# Patient Record
Sex: Male | Born: 1995 | Race: White | Hispanic: No | Smoking: Never smoker
Health system: Southern US, Community
[De-identification: ages and names within clinical notes are randomized; demographics above are authoritative.]

## PROBLEM LIST (undated history)

## (undated) DIAGNOSIS — Q539 Undescended testicle, unspecified: Secondary | ICD-10-CM

## (undated) DIAGNOSIS — G40909 Epilepsy, unspecified, not intractable, without status epilepticus: Secondary | ICD-10-CM

---
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
## 2017-07-16 IMAGING — US US ART/VEN ABD/PELV/SCROTUM DOPPLER LTD
1 series · 14 of 25 positions shown · non-contrast
Comparison: Prior testicular ultrasound 08/17/2011

CLINICAL DATA: 19-year-old male with right testicular pain

EXAM:
ULTRASOUND OF SCROTUM
TECHNIQUE: Complete ultrasound examination of the testicles, epididymis, and
other scrotal structures was performed.

[Series 1: us art/ven abd/pelv/scrotum doppler ltd · 0.07mm/px · 43 acquisitions, 14 frames shown]
[im 1/43]
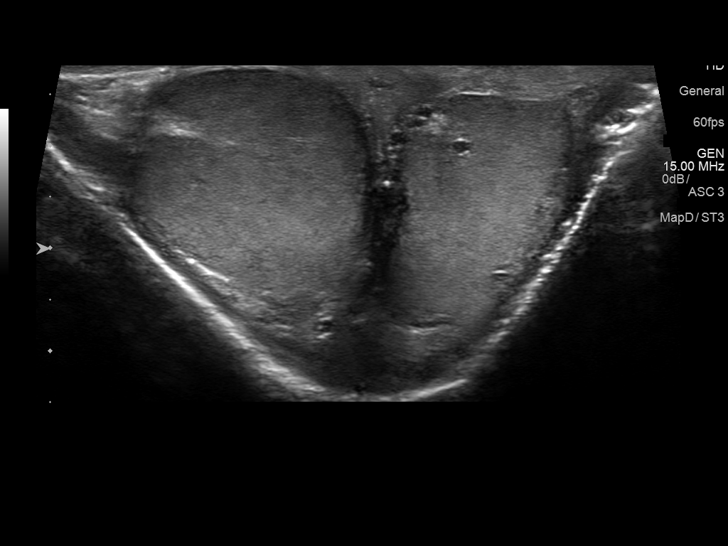
[im 4/43]
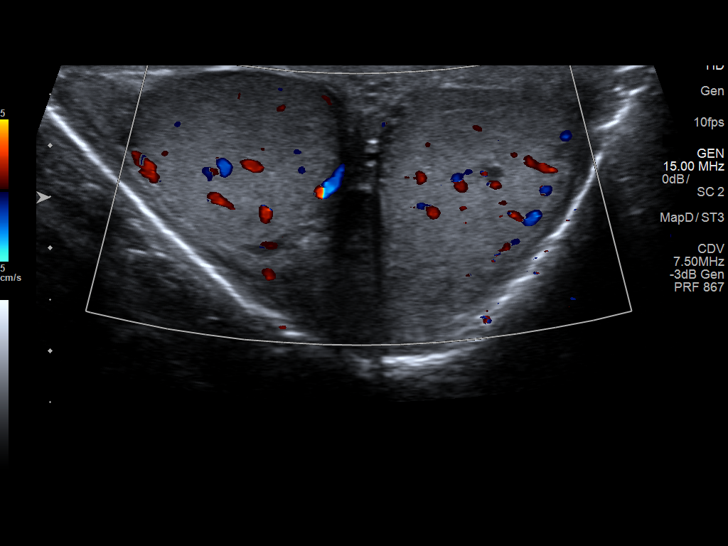
[im 8/43]
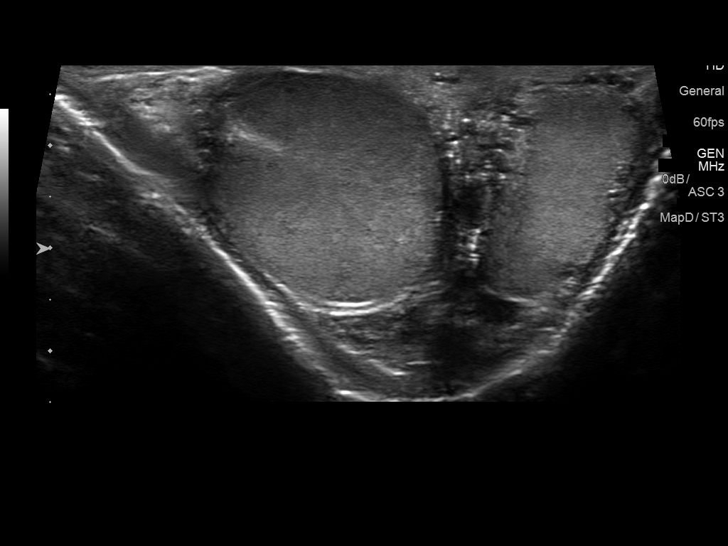
[im 11/43]
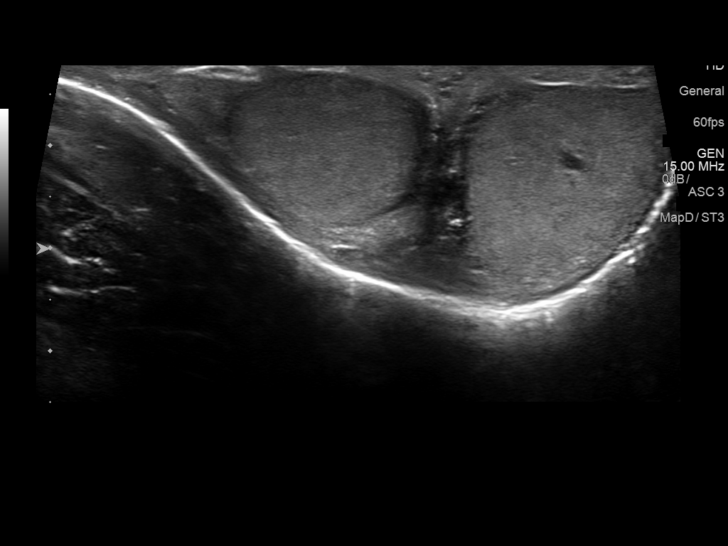
[im 15/43]
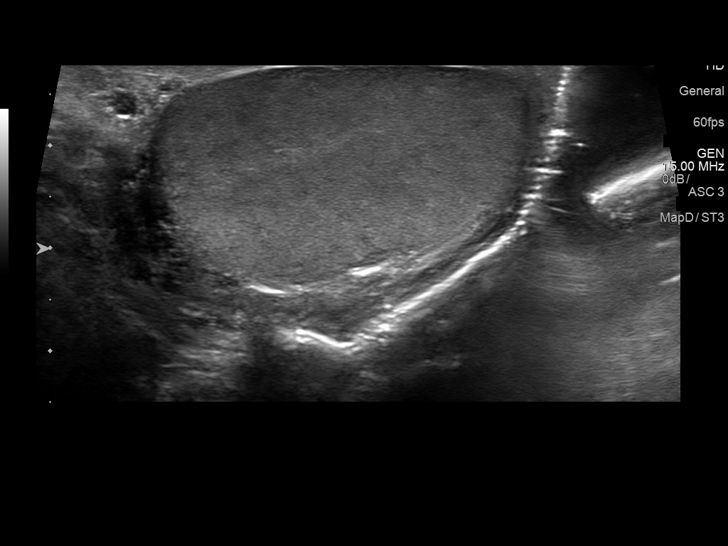
[im 16/43]
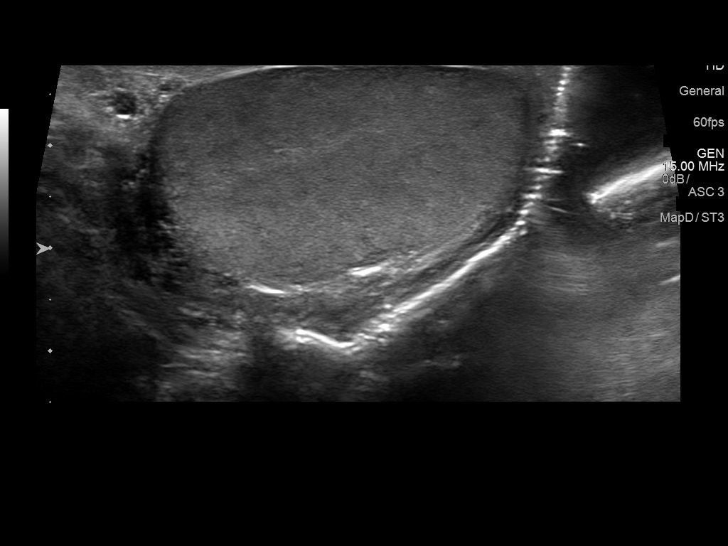
[im 20/43]
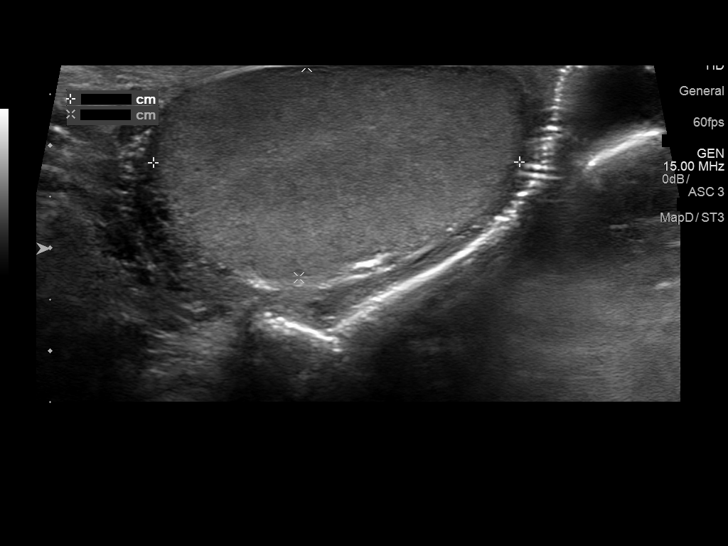
[im 23/43]
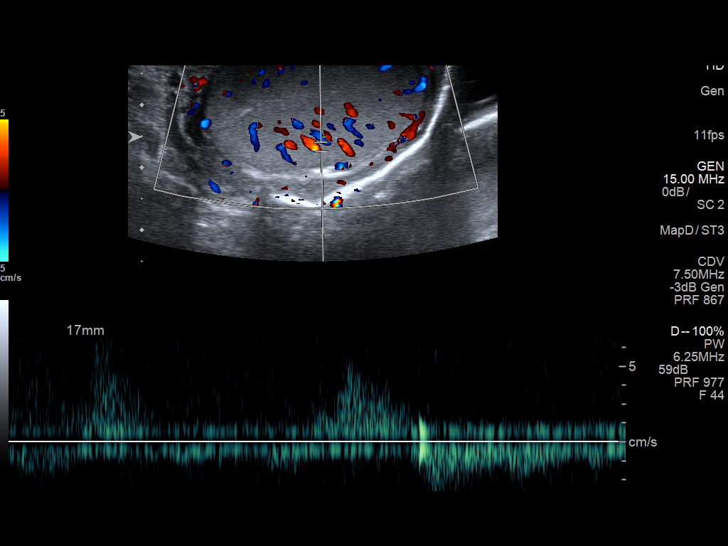
[im 27/43]
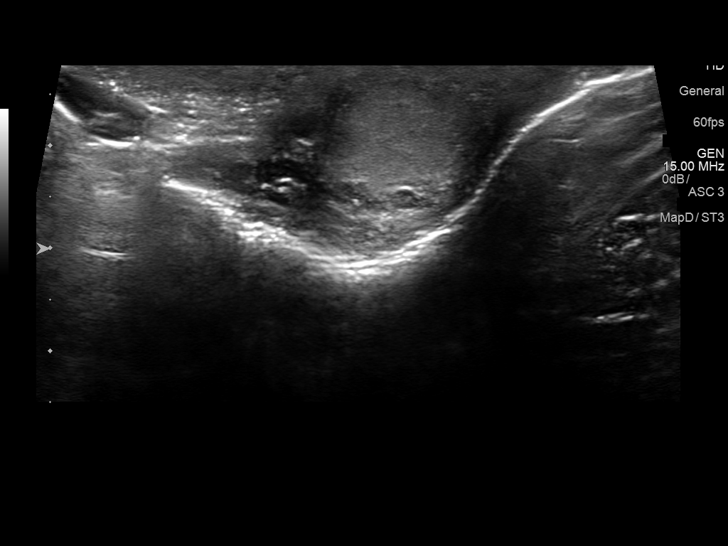
[im 29/43]
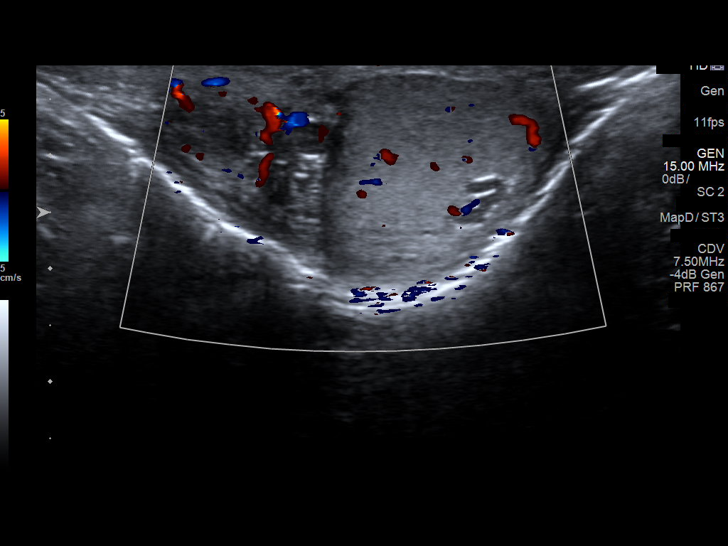
[im 32/43]
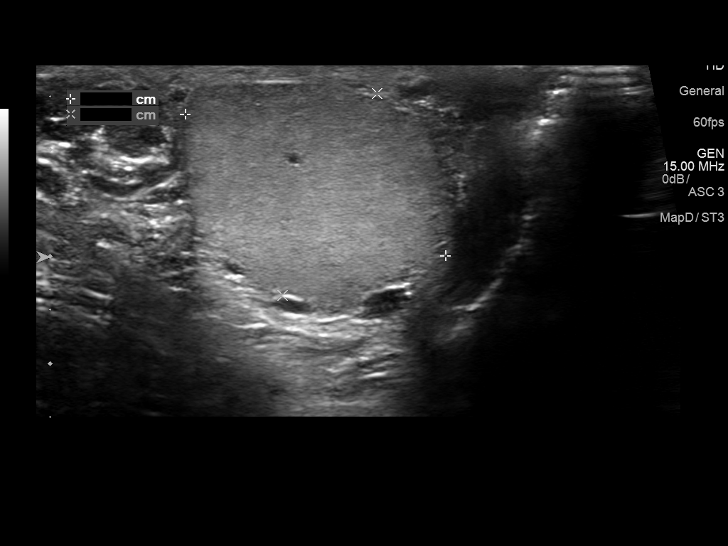
[im 36/43]
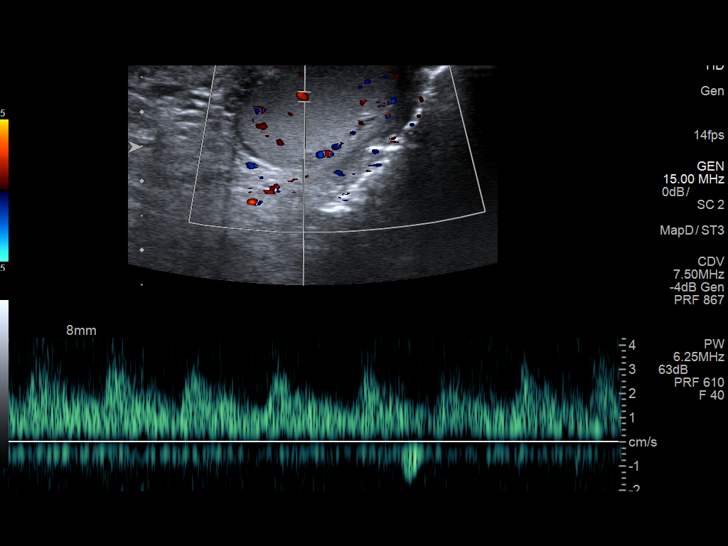
[im 39/43]
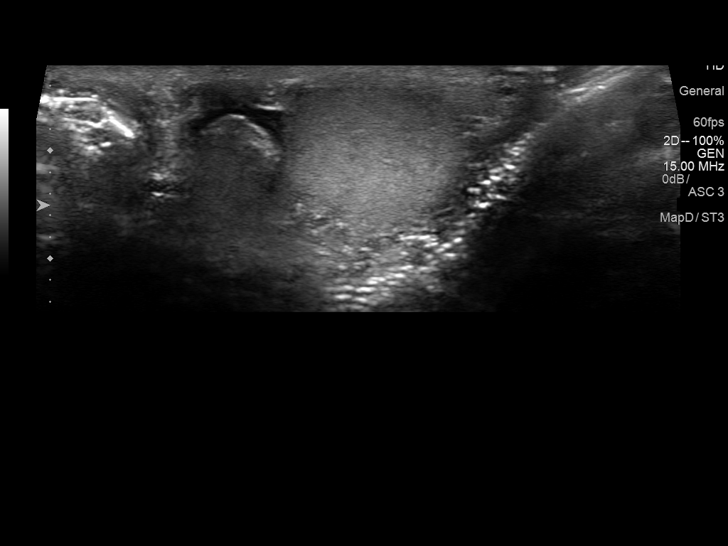
[im 43/43]
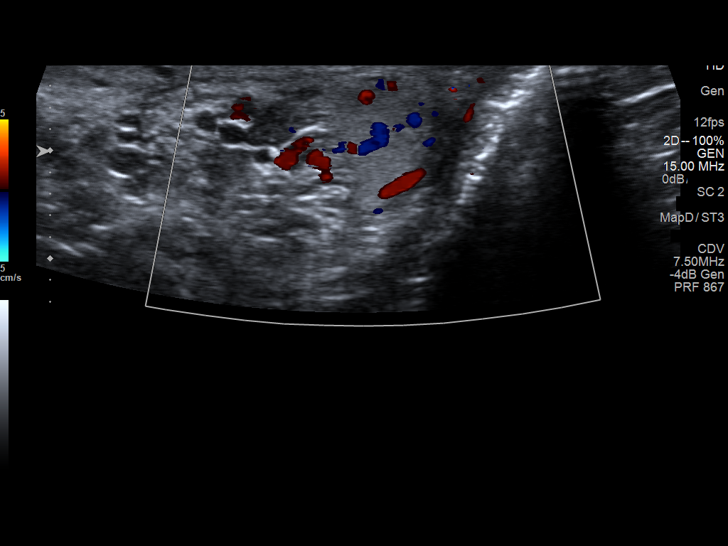

[14 of 25 positions shown; findings below may reference images not displayed]

FINDINGS: Right testicle

Measurements: 3.6 x 2.1 x 2.4 cm. No mass or microlithiasis
visualized.

Left testicle

Measurements: 2.8 x 2.1 x 2.0 cm. No mass or microlithiasis
visualized.

Right epididymis:  Normal in size and appearance.

Left epididymis:  Normal in size and appearance.

Hydrocele:  None visualized.

Varicocele:  None visualized.
IMPRESSION: Negative. No evidence for testicular mass or other significant
abnormality.

## 2020-12-15 ENCOUNTER — Encounter: Payer: Self-pay | Admitting: Emergency Medicine

## 2020-12-15 ENCOUNTER — Other Ambulatory Visit: Payer: Self-pay

## 2020-12-15 DIAGNOSIS — F419 Anxiety disorder, unspecified: Secondary | ICD-10-CM | POA: Insufficient documentation

## 2020-12-15 DIAGNOSIS — R209 Unspecified disturbances of skin sensation: Secondary | ICD-10-CM | POA: Insufficient documentation

## 2020-12-15 DIAGNOSIS — R569 Unspecified convulsions: Secondary | ICD-10-CM | POA: Insufficient documentation

## 2020-12-15 DIAGNOSIS — R404 Transient alteration of awareness: Secondary | ICD-10-CM | POA: Insufficient documentation

## 2020-12-15 DIAGNOSIS — R4182 Altered mental status, unspecified: Secondary | ICD-10-CM | POA: Insufficient documentation

## 2020-12-15 LAB — COMPREHENSIVE METABOLIC PANEL
ALT: 22 U/L (ref 0–44)
AST: 25 U/L (ref 15–41)
Albumin: 4.7 g/dL (ref 3.5–5.0)
Alkaline Phosphatase: 62 U/L (ref 38–126)
Anion gap: 6 (ref 5–15)
BUN: 14 mg/dL (ref 6–20)
CO2: 26 mmol/L (ref 22–32)
Calcium: 9.2 mg/dL (ref 8.9–10.3)
Chloride: 107 mmol/L (ref 98–111)
Creatinine, Ser: 0.93 mg/dL (ref 0.61–1.24)
GFR, Estimated: >60 mL/min (ref >60)
Glucose, Bld: 81 mg/dL (ref 70–99)
Potassium: 3.6 mmol/L (ref 3.5–5.1)
Sodium: 139 mmol/L (ref 135–145)
Total Bilirubin: 1.2 mg/dL (ref 0.3–1.2)
Total Protein: 7.3 g/dL (ref 6.5–8.1)

## 2020-12-15 LAB — URINALYSIS, COMPLETE (UACMP) WITH MICROSCOPIC
Bacteria, UA: NONE SEEN
Bilirubin Urine: NEGATIVE
Glucose, UA: NEGATIVE mg/dL
Hgb urine dipstick: NEGATIVE
Ketones, ur: NEGATIVE mg/dL
Leukocytes,Ua: NEGATIVE
Nitrite: NEGATIVE
Protein, ur: NEGATIVE mg/dL
Specific Gravity, Urine: >1.03 — ABNORMAL HIGH (ref 1.005–1.030)
WBC, UA: NONE SEEN WBC/hpf (ref 0–5)
pH: 6.5 (ref 5.0–8.0)

## 2020-12-15 LAB — CBC
HCT: 42.7 % (ref 39.0–52.0)
Hemoglobin: 15 g/dL (ref 13.0–17.0)
MCH: 31.2 pg (ref 26.0–34.0)
MCHC: 35.1 g/dL (ref 30.0–36.0)
MCV: 88.8 fL (ref 80.0–100.0)
Platelets: 213 10*3/uL (ref 150–400)
RBC: 4.81 MIL/uL (ref 4.22–5.81)
RDW: 11.8 % (ref 11.5–15.5)
WBC: 8.9 10*3/uL (ref 4.0–10.5)
nRBC: 0 % (ref 0.0–0.2)

## 2020-12-15 NOTE — ED Triage Notes (Signed)
Pt states hx of epilepsy as a kid with grand mal seizures. Pt states x several years has noted multiple episodes of deja vu. Pt states took an herb called tong kat ali which has 2 documented cases of causing seizures in epileptics. Pt states has been off his epilepsy medications for years. Pt states had episode of hyperventilating and felt like his body was numb and paralyzed the other day while driving after taking the herb. Pt states started taking the herb because "it helps boost testosterone and boosts overall health", pt states has been taking valerian root and has not noted any episodes of deja vu since taking the valerian root for the last several days. Pt A&O x4, NAD noted at this time.

## 2020-12-16 ENCOUNTER — Emergency Department
Admission: EM | Admit: 2020-12-16 | Discharge: 2020-12-16 | Disposition: A | Discharge status: Home / Self Care | Payer: Self-pay | Attending: Emergency Medicine | Admitting: Emergency Medicine

## 2020-12-16 DIAGNOSIS — R404 Transient alteration of awareness: Secondary | ICD-10-CM

## 2020-12-16 NOTE — ED Provider Notes (Signed)
[facility] Emergency Department Provider Note  ____________________________________________   None    (approximate)  I have reviewed the triage vital signs and the nursing notes.   HISTORY  Chief Complaint Altered Mental Status    HPI Ivan Kramer is a 25 y.o. male who had childhood epilepsy but has not been on medication for years.  He presents tonight with several recent episodes that concerned him that he may be having seizures "may be of the temporal lobe".  He has been taking a couple of different supplements or herbs and is not sure if that is affecting it.  He described the sensation of one of his episodes as getting numb all over and getting very anxious about it and then not being able to speak or hold his phone but then the symptoms completely went away within a few minutes.  Patient denies alcohol and drug use.  No pain.  No shortness of breath.  It is happened multiple times but he has not followed up with his primary care doctor.  He does not see a neurologist.  Nothing in particular made it better or worse and they were acute in onset and severe.     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   History reviewed. No pertinent surgical history.  Prior to Admission medications   Medication Sig Start Date End Date Taking? Authorizing Provider  aspirin EC 81 MG tablet Take 81 mg by mouth every 4 (four) hours as needed for mild pain.    [provider]  pseudoephedrine (SUDAFED) 30 MG tablet Take 30 mg by mouth every 4 (four) hours as needed for congestion.    [provider]    Allergies Patient has no known allergies.  History reviewed. No pertinent family history.  Social History Social History   Tobacco Use   Smoking status: Never  Substance Use Topics   Alcohol use: No   Drug use: No     Review of Systems Constitutional: No fever/chills Eyes: No visual changes. ENT: No sore throat. Cardiovascular: Denies chest pain. Respiratory: Denies shortness of breath. Gastrointestinal: No abdominal pain.  No nausea, no vomiting.  No diarrhea.  No constipation. Genitourinary: Negative for dysuria. Musculoskeletal: Negative for neck pain.  Negative for back pain. Integumentary: Negative for rash. Neurological: Negative for headaches and focal weakness.  Positive for numbness that resolved after a few minutes. Psychiatric: Some anxiety associated with his symptoms.  ____________________________________________   PHYSICAL EXAM:  VITAL SIGNS: ED Triage Vitals  Enc Vitals Group     BP 12/15/20 2130 (!) 145/83     Pulse Rate 12/15/20 2130 70     Resp 12/15/20 2130 20     Temp 12/15/20 2130 97.9 F (36.6 C)     Temp Source 12/15/20 2130 Oral     SpO2 12/15/20 2130 98 %     Weight 12/15/20 2129 72.6 kg (160 lb)     Height 12/15/20 2129 1.727 m (5\' 8" )     Head Circumference --      Peak Flow --      Pain Score 12/15/20 2129 0     Pain Loc --      Pain Edu? --      Excl. in GC? --     Constitutional: Alert and oriented.  Eyes: Conjunctivae are  normal.  Head: Atraumatic. Nose: No congestion/rhinnorhea. Mouth/Throat: Patient is wearing a mask. Neck: No stridor.  No meningeal signs.   Cardiovascular: Normal rate, regular rhythm. Good peripheral circulation. Respiratory: Normal respiratory effort.  No retractions. Gastrointestinal: Soft and nontender. No distention.  Musculoskeletal: No lower extremity tenderness nor edema. No gross deformities of extremities. Neurologic:  Normal speech and language. No gross focal neurologic deficits are appreciated.  Skin:  Skin is warm, dry and intact. Psychiatric: Mood and affect are normal. Speech and behavior are normal.  Calm and cooperative.  Not responding to internal stimuli.  ____________________________________________    LABS (all labs ordered are listed, but only abnormal results are displayed)  Labs Reviewed  URINALYSIS, COMPLETE (UACMP) WITH MICROSCOPIC - Abnormal; Notable for the following components:      Result Value   Specific Gravity, Urine >1.030 (*)    All other components within normal limits  COMPREHENSIVE METABOLIC PANEL  CBC  CBG MONITORING, ED   ____________________________________________   INITIAL IMPRESSION / MDM / ASSESSMENT AND PLAN / ED COURSE  As part of my medical decision making, I reviewed the following data within the electronic MEDICAL RECORD NUMBER Nursing notes reviewed and incorporated, Labs reviewed , Old chart reviewed, and Notes from prior ED visits   Differential diagnosis includes, but is not limited to, medication or drug side effect, panic attacks, other nonspecific psychiatric disorder, seizures or pseudoseizures.  Patient has been waiting in the emergency department more than 8 hours due to overwhelming hospital volumes and limited staffing.  He has had no additional episodes.  I think that anxiety and/or substances may play a role.  There is no indication that he would benefit from admission at this time.    I talked with him about the reassuring work-up including stable vital signs, normal CMP, normal urinalysis, normal CBC.  There is no indication for emergent imaging of his head such as with a head CT or MRI.  He has a primary care physician with whom he can follow-up and I also gave him information about how to follow-up with Dr. Malvin Johns.  I also put in an ambulatory referral to neurology in [facility] which he said he would be able to visit if needed.  I gave my usual and customary return precautions.         ____________________________________________  FINAL CLINICAL IMPRESSION(S) / ED DIAGNOSES  Final diagnoses:  Transient alteration of awareness     MEDICATIONS GIVEN DURING THIS VISIT:  Medications - No data to display   ED Discharge Orders           Ordered    Ambulatory referral to Neurology       Comments: An appointment is requested in approximately: 2 weeks.  History of epilepsy as a child.  Not on meds for years, but having symptoms recently that concern him he may be again having seizures.   12/16/20 7510             Note:  This document was prepared using Dragon voice recognition software and may include unintentional dictation errors.   Loleta Rose, MD 12/16/20 [phone]

## 2020-12-16 NOTE — Discharge Instructions (Addendum)
Your workup in the Emergency Department today was reassuring.  We did not find any specific abnormalities.  We recommend you drink plenty of fluids, take your regular medications and/or any new ones prescribed today, and follow up with the doctor(s) listed in these documents as recommended.  Return to the Emergency Department if you develop new or worsening symptoms that concern you.  

## 2021-09-23 ENCOUNTER — Ambulatory Visit (HOSPITAL_COMMUNITY): Payer: Self-pay

## 2021-09-23 ENCOUNTER — Ambulatory Visit
Admission: EM | Admit: 2021-09-23 | Discharge: 2021-09-23 | Disposition: A | Discharge status: Home / Self Care | Payer: Commercial Managed Care - HMO | Attending: Emergency Medicine | Admitting: Emergency Medicine

## 2021-09-23 DIAGNOSIS — F17200 Nicotine dependence, unspecified, uncomplicated: Secondary | ICD-10-CM | POA: Insufficient documentation

## 2021-09-23 DIAGNOSIS — F17211 Nicotine dependence, cigarettes, in remission: Secondary | ICD-10-CM | POA: Insufficient documentation

## 2021-09-23 DIAGNOSIS — F1890 Inhalant use, unspecified, uncomplicated: Secondary | ICD-10-CM

## 2021-09-23 DIAGNOSIS — Z20822 Contact with and (suspected) exposure to covid-19: Secondary | ICD-10-CM | POA: Diagnosis not present

## 2021-09-23 DIAGNOSIS — B349 Viral infection, unspecified: Secondary | ICD-10-CM | POA: Diagnosis not present

## 2021-09-23 DIAGNOSIS — J358 Other chronic diseases of tonsils and adenoids: Secondary | ICD-10-CM | POA: Diagnosis not present

## 2021-09-23 DIAGNOSIS — K219 Gastro-esophageal reflux disease without esophagitis: Secondary | ICD-10-CM | POA: Diagnosis not present

## 2021-09-23 DIAGNOSIS — F1220 Cannabis dependence, uncomplicated: Secondary | ICD-10-CM | POA: Insufficient documentation

## 2021-09-23 MED ORDER — OSELTAMIVIR PHOSPHATE 75 MG PO CAPS: 75.0000 mg | ORAL_CAPSULE | Freq: Two times a day (BID) | ORAL | 0 refills | Status: AC

## 2021-09-23 MED ORDER — LANSOPRAZOLE 30 MG PO CPDR: 30.0000 mg | DELAYED_RELEASE_CAPSULE | Freq: Every morning | ORAL | 2 refills | Status: DC

## 2021-09-23 MED ORDER — IBUPROFEN 800 MG PO TABS
800.0000 mg | ORAL_TABLET | Freq: Once | ORAL | Status: AC
  Administered 2021-09-23: 800 mg via ORAL

## 2021-09-23 MED ORDER — CHLORHEXIDINE GLUCONATE 0.12 % MT SOLN: 15.0000 mL | Freq: Two times a day (BID) | OROMUCOSAL | 2 refills | Status: DC

## 2021-09-23 NOTE — ED Provider Notes (Signed)
[facility]    CSN: [id number] Arrival date & time: 09/23/21  1336    HISTORY   Chief Complaint  Patient presents with   Sore Throat   Fever   HPI Ivan Kramer is a pleasant, 26 y.o. male who presents to urgent care today. Patient complains of swollen tonsils, fever with Tmax 101 and a history of tonsil stones, states symptoms began last night.  Patient states his symptoms began last night.  Patient states he has been taking Motrin.  Patient reports a history of smoking cigarettes however has quit recently and decided to vape instead.  Patient states he also frequently smokes marijuana but has been trying to use edibles instead because he feels that this is better for his health.  Patient reports a history of GERD, reports discomfort 2-3 times per week.  Patient denies nausea, vomiting, diarrhea, body aches, chills, headache, ear pain, cough, nasal congestion, runny nose.  The history is provided by the patient.   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  History reviewed. No pertinent surgical history.  Home Medications    Prior to Admission medications   Medication Sig Start Date End Date Taking? Authorizing Provider  chlorhexidine (PERIDEX) 0.12 % solution Use as directed 15 mLs in the mouth or throat 2 (two) times daily. 09/23/21  Yes Theadora Rama Scales, PA-C  lansoprazole (PREVACID) 30 MG capsule Take 1 capsule (30 mg total) by mouth in the morning. 30 minutes prior to breakfast meal 09/23/21 12/22/21 Yes Theadora Rama Scales, PA-C    Family History History reviewed. No pertinent family history. Social History Social History   Tobacco Use   Smoking status: Never  Vaping Use   Vaping Use: Some days  Substance Use Topics   Alcohol use: No   Drug use: No   Allergies   Lamotrigine  Review of Systems Review of Systems Pertinent  findings revealed after performing a 14 point review of systems has been noted in the history of present illness.  Physical Exam Triage Vital Signs ED Triage Vitals  Enc Vitals Group     BP 11/16/20 0827 (!) 147/82     Pulse Rate 11/16/20 0827 72     Resp 11/16/20 0827 18     Temp 11/16/20 0827 98.3 F (36.8 C)     Temp Source 11/16/20 0827 Oral     SpO2 11/16/20 0827 98 %     Weight --      Height --      Head Circumference --      Peak Flow --      Pain Score 11/16/20 0826 5     Pain Loc --      Pain Edu? --      Excl. in GC? --   No data found.  Updated Vital Signs BP 115/70 (BP Location: Left Arm)   Pulse 100   Temp 99.4 F (37.4 C) (Oral)   Resp 16   SpO2 97%   Physical Exam Vitals and nursing note reviewed.  Constitutional:      General: He is not in acute distress.    Appearance: Normal appearance. He is not ill-appearing.  HENT:     Head: Normocephalic and atraumatic.     Salivary Glands: Right salivary gland is not diffusely enlarged or tender. Left salivary gland is not diffusely enlarged  or tender.     Right Ear: Tympanic membrane, ear canal and external ear normal. No drainage. No middle ear effusion. There is no impacted cerumen. Tympanic membrane is not erythematous or bulging.     Left Ear: Tympanic membrane, ear canal and external ear normal. No drainage.  No middle ear effusion. There is no impacted cerumen. Tympanic membrane is not erythematous or bulging.     Nose: Nose normal. No nasal deformity, septal deviation, mucosal edema, congestion or rhinorrhea.     Right Turbinates: Not enlarged, swollen or pale.     Left Turbinates: Not enlarged, swollen or pale.     Right Sinus: No maxillary sinus tenderness or frontal sinus tenderness.     Left Sinus: No maxillary sinus tenderness or frontal sinus tenderness.     Mouth/Throat:     Lips: Pink. No lesions.     Mouth: Mucous membranes are moist. No oral lesions.     Pharynx: Oropharynx is clear. Uvula  midline. Posterior oropharyngeal erythema present. No pharyngeal swelling, oropharyngeal exudate or uvula swelling.     Tonsils: No tonsillar exudate or tonsillar abscesses. 1+ on the right. 1+ on the left.  Eyes:     General: Lids are normal.        Right eye: No discharge.        Left eye: No discharge.     Extraocular Movements: Extraocular movements intact.     Conjunctiva/sclera: Conjunctivae normal.     Right eye: Right conjunctiva is not injected.     Left eye: Left conjunctiva is not injected.  Neck:     Trachea: Trachea and phonation normal.  Cardiovascular:     Rate and Rhythm: Normal rate and regular rhythm.     Pulses: Normal pulses.     Heart sounds: Normal heart sounds. No murmur heard.    No friction rub. No gallop.  Pulmonary:     Effort: Pulmonary effort is normal. No accessory muscle usage, prolonged expiration or respiratory distress.     Breath sounds: Normal breath sounds. No stridor, decreased air movement or transmitted upper airway sounds. No decreased breath sounds, wheezing, rhonchi or rales.  Chest:     Chest wall: No tenderness.  Musculoskeletal:        General: Normal range of motion.     Cervical back: Normal range of motion and neck supple. Normal range of motion.  Lymphadenopathy:     Cervical: Cervical adenopathy present.  Skin:    General: Skin is warm and dry.     Findings: No erythema or rash.  Neurological:     General: No focal deficit present.     Mental Status: He is alert and oriented to person, place, and time.  Psychiatric:        Mood and Affect: Mood normal.        Behavior: Behavior normal.     Visual Acuity Right Eye Distance:   Left Eye Distance:   Bilateral Distance:    Right Eye Near:   Left Eye Near:    Bilateral Near:     UC Couse / Diagnostics / Procedures:     Radiology No results found.  Procedures Procedures (including critical care time) EKG  Pending results:  Labs Reviewed  RESP PANEL BY RT-PCR (FLU  A&B, COVID) ARPGX2    Medications Ordered in UC: Medications  ibuprofen (ADVIL) tablet 800 mg (800 mg Oral Given 09/23/21 1619)    UC Diagnoses / Final Clinical Impressions(s)   I  have reviewed the triage vital signs and the nursing notes.  Pertinent labs & imaging results that were available during my care of the patient were reviewed by me and considered in my medical decision making (see chart for details).    Final diagnoses:  Viral illness  Gastroesophageal reflux disease without esophagitis  Tonsillolith  Nicotine dependence due to vaping non-tobacco product  Cigarette nicotine dependence in remission  Marijuana dependence (HCC)   Physical exam today is remarkable for mild pharyngeal erythema, mild swelling of tonsils and cervical lymphadenopathy.  Patient provided with PPI given frequent episodes of GERD and chlorhexidine prep given complaints of tonsillolith.  Because of patient's reported fever, patient provided with a prescription for Tamiflu while he awaits the results of his COVID and influenza testing which she requested today.  Patient counseled regarding the risks of chronic marijuana use and dependence.  Supportive care recommended.  Return precautions advised.  ED Prescriptions     Medication Sig Dispense Auth. Provider   chlorhexidine (PERIDEX) 0.12 % solution Use as directed 15 mLs in the mouth or throat 2 (two) times daily. 120 mL Theadora Rama Scales, PA-C   lansoprazole (PREVACID) 30 MG capsule Take 1 capsule (30 mg total) by mouth in the morning. 30 minutes prior to breakfast meal 30 capsule Theadora Rama Scales, PA-C   oseltamivir (TAMIFLU) 75 MG capsule Take 1 capsule (75 mg total) by mouth every 12 (twelve) hours for 5 days. 10 capsule Theadora Rama Scales, PA-C      PDMP not reviewed this encounter.  Pending results:  Labs Reviewed  RESP PANEL BY RT-PCR (FLU A&B, COVID) ARPGX2    Discharge Instructions:   Discharge Instructions      The  result of your COVID-19 and influenza PCR testing will be posted to your MyChart once it is complete, typically this takes 6 to 12 hours.  I recommend that you begin taking Tamiflu now for empiric treatment of presumed influenza while you are waiting for the results of your PCR test.  Your test result may not be available until tomorrow in the window of benefit for Tamiflu was 48 hours from onset of symptoms.  For tonsil stones, I recommend 2 things.  The first is rinsing and gargling with Peridex oral solution after every meal and the second is beginning an acid reducing medication called Prevacid once daily every morning before your first meal.  I have sent prescriptions for both these medications to your pharmacy.  I have enclosed some information that I hope you find useful about gastroesophageal reflux disease, tonsil stones, nicotine dependence and marijuana use.  I strongly recommend that you consider discontinuing both for preservation of your long-term physical and mental health.  Thank you for visiting urgent care today.         Disposition Upon Discharge:  Condition: stable for discharge home  Patient presented with an acute illness with associated systemic symptoms and significant discomfort requiring urgent management. In my opinion, this is a condition that a prudent lay person (someone who possesses an average knowledge of health and medicine) may potentially expect to result in complications if not addressed urgently such as respiratory distress, impairment of bodily function or dysfunction of bodily organs.   Routine symptom specific, illness specific and/or disease specific instructions were discussed with the patient and/or caregiver at length.   As such, the patient has been evaluated and assessed, work-up was performed and treatment was provided in alignment with urgent care protocols and evidence based medicine.  Patient/parent/caregiver has been advised that the patient  may require follow up for further testing and treatment if the symptoms continue in spite of treatment, as clinically indicated and appropriate.  Patient/parent/caregiver has been advised to return to the [facility] or PCP if no better; to PCP or the Emergency Department if new signs and symptoms develop, or if the current signs or symptoms continue to change or worsen for further workup, evaluation and treatment as clinically indicated and appropriate  The patient will follow up with their current PCP if and as advised. If the patient does not currently have a PCP we will assist them in obtaining one.   The patient may need specialty follow up if the symptoms continue, in spite of conservative treatment and management, for further workup, evaluation, consultation and treatment as clinically indicated and appropriate.   Patient/parent/caregiver verbalized understanding and agreement of plan as discussed.  All questions were addressed during visit.  Please see discharge instructions below for further details of plan.  This office note has been dictated using Teaching laboratory technician.  Unfortunately, this method of dictation can sometimes lead to typographical or grammatical errors.  I apologize for your inconvenience in advance if this occurs.  Please do not hesitate to reach out to me if clarification is needed.      Theadora Rama Scales, PA-C 09/24/21 1340

## 2021-09-23 NOTE — ED Triage Notes (Signed)
The pt c/o tonsil swelling, fever 101F, and tonsil stones.  Started: last night   Home interventions: motrin

## 2021-09-23 NOTE — Discharge Instructions (Addendum)
The result of your COVID-19 and influenza PCR testing will be posted to your MyChart once it is complete, typically this takes 6 to 12 hours.  I recommend that you begin taking Tamiflu now for empiric treatment of presumed influenza while you are waiting for the results of your PCR test.  Your test result may not be available until tomorrow in the window of benefit for Tamiflu was 48 hours from onset of symptoms.  For tonsil stones, I recommend 2 things.  The first is rinsing and gargling with Peridex oral solution after every meal and the second is beginning an acid reducing medication called Prevacid once daily every morning before your first meal.  I have sent prescriptions for both these medications to your pharmacy.  I have enclosed some information that I hope you find useful about gastroesophageal reflux disease, tonsil stones, nicotine dependence and marijuana use.  I strongly recommend that you consider discontinuing both for preservation of your long-term physical and mental health.  Thank you for visiting urgent care today.

## 2021-09-24 LAB — RESP PANEL BY RT-PCR (FLU A&B, COVID) ARPGX2
Influenza A by PCR: NEGATIVE
Influenza B by PCR: NEGATIVE
SARS Coronavirus 2 by RT PCR: NEGATIVE

## 2022-03-11 DIAGNOSIS — R4182 Altered mental status, unspecified: Secondary | ICD-10-CM | POA: Diagnosis not present

## 2022-03-11 DIAGNOSIS — R300 Dysuria: Secondary | ICD-10-CM | POA: Diagnosis not present

## 2022-03-25 DIAGNOSIS — K2970 Gastritis, unspecified, without bleeding: Secondary | ICD-10-CM | POA: Diagnosis not present

## 2022-03-28 ENCOUNTER — Telehealth: Payer: Self-pay

## 2022-03-28 NOTE — Telephone Encounter (Signed)
Mychart msg sent

## 2022-04-07 DIAGNOSIS — K529 Noninfective gastroenteritis and colitis, unspecified: Secondary | ICD-10-CM | POA: Diagnosis not present

## 2022-04-15 DIAGNOSIS — R300 Dysuria: Secondary | ICD-10-CM | POA: Diagnosis not present

## 2022-05-21 ENCOUNTER — Institutional Professional Consult (permissible substitution): Payer: Commercial Managed Care - HMO | Admitting: Neurology

## 2022-06-19 DIAGNOSIS — R5383 Other fatigue: Secondary | ICD-10-CM | POA: Diagnosis not present

## 2022-07-01 ENCOUNTER — Institutional Professional Consult (permissible substitution): Payer: Commercial Managed Care - HMO | Admitting: Neurology

## 2022-08-04 ENCOUNTER — Ambulatory Visit
Admission: EM | Admit: 2022-08-04 | Discharge: 2022-08-04 | Disposition: A | Discharge status: Home / Self Care | Payer: Medicaid Other | Attending: Urgent Care | Admitting: Urgent Care

## 2022-08-04 DIAGNOSIS — N410 Acute prostatitis: Secondary | ICD-10-CM | POA: Diagnosis not present

## 2022-08-04 LAB — POCT URINALYSIS DIP (MANUAL ENTRY)
Blood, UA: NEGATIVE
Glucose, UA: 250 mg/dL — AB
Nitrite, UA: POSITIVE — AB
Protein Ur, POC: >=300 mg/dL — AB
Spec Grav, UA: 1.025 (ref 1.010–1.025)
Urobilinogen, UA: >=8 E.U./dL — AB
pH, UA: 5 (ref 5.0–8.0)

## 2022-08-04 MED ORDER — CIPROFLOXACIN HCL 500 MG PO TABS: 500.0000 mg | ORAL_TABLET | Freq: Two times a day (BID) | ORAL | 0 refills | Status: AC

## 2022-08-04 MED ORDER — TAMSULOSIN HCL 0.4 MG PO CAPS: 0.4000 mg | ORAL_CAPSULE | Freq: Every day | ORAL | 0 refills | Status: AC

## 2022-08-04 NOTE — ED Provider Notes (Signed)
[facility]    CSN: [id number] Arrival date & time: 08/04/22  1619      History   Chief Complaint Chief Complaint  Patient presents with   Abdominal Pain    HPI Ivan Kramer is a 27 y.o. male.   Pleasant 27 year old male presents today due to concerns of pressure across his lower pelvic region for the past 2 to 3 days.  He denies a significant pain but reports it is a feeling of "a while being pushed against him".  It does occasionally radiate to his back.  He has a history of UTIs in the past, recently treated with doxycycline per his statement.  Patient reports concerned because he he is now having hesitancy as well.  He feels that he is also having some decrease in bowel movements.  He denies a history of a hernia and denies a bulge, although he does report a possible cyst to his right lower abdominal wall.  He denies a fever.  He states his urine was more cloudy than normal, and it does hurt to pee.  He denies any penile discharge, no known exposure to STI.  He has been taking over-the-counter Azo. He states a while back he was having pain in testicle on R but that has since resolved. No testicular pain or scrotal swelling.   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   History reviewed. No pertinent surgical history.     Home Medications    Prior to Admission medications   Medication Sig Start Date End Date Taking? Authorizing Provider  chlorhexidine (PERIDEX) 0.12 % solution Use as directed 15 mLs in the mouth or throat 2 (two) times daily. 09/23/21   Theadora Rama Scales, PA-C  lansoprazole (PREVACID) 30 MG capsule Take 1 capsule (30 mg total) by mouth in the morning. 30 minutes prior to breakfast meal 09/23/21 12/22/21  Theadora Rama Scales, PA-C    Family History History reviewed. No pertinent family history.  Social  History Social History   Tobacco Use   Smoking status: Never  Vaping Use   Vaping status: Former  Substance Use Topics   Alcohol use: No   Drug use: Yes    Frequency: 7.0 times per week    Types: Marijuana     Allergies   Lamotrigine   Review of Systems Review of Systems  Gastrointestinal:  Positive for abdominal pain.  As per HPI   Physical Exam Triage Vital Signs ED Triage Vitals  Encounter Vitals Group     BP 08/04/22 1654 117/82     Systolic BP Percentile --      Diastolic BP Percentile --      Pulse Rate 08/04/22 1654 87     Resp 08/04/22 1654 18     Temp 08/04/22 1654 99 F (37.2 C)     Temp Source 08/04/22 1654 Oral     SpO2 08/04/22 1654 95 %     Weight --      Height --      Head Circumference --      Peak Flow --      Pain Score 08/04/22 1700 5     Pain Loc --      Pain Education --      Exclude from Growth Chart --    No data  found.  Updated Vital Signs BP 117/82 (BP Location: Right Arm)   Pulse 87   Temp 99 F (37.2 C) (Oral)   Resp 18   SpO2 95%   Visual Acuity Right Eye Distance:   Left Eye Distance:   Bilateral Distance:    Right Eye Near:   Left Eye Near:    Bilateral Near:     Physical Exam Vitals and nursing note reviewed. Exam conducted with a chaperone present.  Constitutional:      General: He is not in acute distress.    Appearance: He is well-developed and normal weight. He is not ill-appearing, toxic-appearing or diaphoretic.  HENT:     Head: Normocephalic and atraumatic.  Eyes:     Conjunctiva/sclera: Conjunctivae normal.  Cardiovascular:     Rate and Rhythm: Normal rate and regular rhythm.     Heart sounds: No murmur heard. Pulmonary:     Effort: Pulmonary effort is normal. No respiratory distress.     Breath sounds: Normal breath sounds.  Abdominal:     Palpations: Abdomen is soft.     Tenderness: There is no abdominal tenderness.  Genitourinary:    Testes: Normal.     Prostate: Enlarged. No nodules  present.  Musculoskeletal:        General: No swelling.     Cervical back: Neck supple.  Skin:    General: Skin is warm and dry.     Capillary Refill: Capillary refill takes less than 2 seconds.  Neurological:     Mental Status: He is alert.  Psychiatric:        Mood and Affect: Mood normal.      UC Treatments / Results  Labs (all labs ordered are listed, but only abnormal results are displayed) Labs Reviewed  POCT URINALYSIS DIP (MANUAL ENTRY) - Abnormal; Notable for the following components:      Result Value   Color, UA orange (*)    Glucose, UA =250 (*)    Bilirubin, UA moderate (*)    Ketones, POC UA small (15) (*)    Protein Ur, POC >=300 (*)    Urobilinogen, UA >=8.0 (*)    Nitrite, UA Positive (*)    Leukocytes, UA Large (3+) (*)    All other components within normal limits  URINE CULTURE  CYTOLOGY, (ORAL, ANAL, URETHRAL) ANCILLARY ONLY    EKG   Radiology No results found.  Procedures Procedures (including critical care time)  Medications Ordered in UC Medications - No data to display  Initial Impression / Assessment and Plan / UC Course  I have reviewed the triage vital signs and the nursing notes.  Pertinent labs & imaging results that were available during my care of the patient were reviewed by me and considered in my medical decision making (see chart for details).     *** Final Clinical Impressions(s) / UC Diagnoses   Final diagnoses:  Prostatitis, acute   Discharge Instructions   None    ED Prescriptions   None    PDMP not reviewed this encounter.

## 2022-08-04 NOTE — ED Triage Notes (Signed)
Pt reports pain in right lower quadrant, low back pain and difficulty hurting x 1 week.

## 2022-08-04 NOTE — Discharge Instructions (Addendum)
We are treating you for suspected acute prostatitis, which is usually a bacterial infection of the prostate.  We will call with the results of the urethral swab and urine culture pending the results.  Please start taking the ciprofloxacin twice daily.  Avoid excessive sun exposure as this can increase photosensitivity.  Please read all black box warnings on it, should you develop severe ankle pain, stop the medication and contact our office.    Start taking tamsulosin or Flomax once daily to help with your urine output.  If your symptoms continue, if you develop fever, if you develop any testicular pain, or nausea and vomiting, please head to the emergency room for further evaluation.  Call your PCP tomorrow to schedule a follow up in the next 2-3 weeks.

## 2022-08-05 ENCOUNTER — Telehealth: Payer: Self-pay

## 2022-08-05 LAB — URINE CULTURE: Culture: NO GROWTH

## 2022-08-05 MED ORDER — SULFAMETHOXAZOLE-TRIMETHOPRIM 800-160 MG PO TABS: 1.0000 | ORAL_TABLET | Freq: Two times a day (BID) | ORAL | 0 refills | Status: AC

## 2022-08-05 NOTE — Telephone Encounter (Signed)
Returned phone call to pt, as pt states he feels weak, numbness and tingling in feets, feels paranoid after he started taking ciprofloxacin and read teh side effects. Told pt to go to the ED. After telling to go to the ED, pt states he just said he just wanted to "speak with someone" on the phone.  I explained to pt he had some glucose in urine and no history of diabetes and needs to be ruled out.

## 2022-08-05 NOTE — Telephone Encounter (Signed)
Pt had questions and is paranoid perhaps about his tendons being affected while taking Cipro and while walking today felt weakness. I offered him to come in for an exam so I can check them. He will come if he gets a ride.

## 2022-08-05 NOTE — Telephone Encounter (Signed)
Pt was contacted regarding cipro. He is wanted to change to a different abx due to side effect profile and feeling "weird" on it. Will change to bactrim which will also cover for possible prostatitis. Pt understands to DC cipro, and will continue flomax.

## 2022-08-06 LAB — CYTOLOGY, (ORAL, ANAL, URETHRAL) ANCILLARY ONLY
Chlamydia: NEGATIVE
Comment: NEGATIVE
Comment: NEGATIVE
Comment: NORMAL
Neisseria Gonorrhea: NEGATIVE
Trichomonas: NEGATIVE

## 2022-09-15 ENCOUNTER — Institutional Professional Consult (permissible substitution): Payer: Commercial Managed Care - HMO | Admitting: Neurology

## 2022-09-15 ENCOUNTER — Encounter: Payer: Self-pay | Admitting: Neurology

## 2022-09-16 ENCOUNTER — Telehealth: Payer: Self-pay | Admitting: Neurology

## 2022-09-16 NOTE — Telephone Encounter (Signed)
Pt was no show to scheduled sleep consult at 1 pm on 09/15/2022

## 2022-11-18 DIAGNOSIS — F4310 Post-traumatic stress disorder, unspecified: Secondary | ICD-10-CM | POA: Diagnosis not present

## 2022-11-18 DIAGNOSIS — F339 Major depressive disorder, recurrent, unspecified: Secondary | ICD-10-CM | POA: Diagnosis not present

## 2022-11-18 DIAGNOSIS — F411 Generalized anxiety disorder: Secondary | ICD-10-CM | POA: Diagnosis not present

## 2022-11-25 DIAGNOSIS — F4310 Post-traumatic stress disorder, unspecified: Secondary | ICD-10-CM | POA: Diagnosis not present

## 2022-11-25 DIAGNOSIS — F339 Major depressive disorder, recurrent, unspecified: Secondary | ICD-10-CM | POA: Diagnosis not present

## 2022-11-25 DIAGNOSIS — F411 Generalized anxiety disorder: Secondary | ICD-10-CM | POA: Diagnosis not present

## 2022-12-03 DIAGNOSIS — F4310 Post-traumatic stress disorder, unspecified: Secondary | ICD-10-CM | POA: Diagnosis not present

## 2022-12-03 DIAGNOSIS — F411 Generalized anxiety disorder: Secondary | ICD-10-CM | POA: Diagnosis not present

## 2022-12-03 DIAGNOSIS — F339 Major depressive disorder, recurrent, unspecified: Secondary | ICD-10-CM | POA: Diagnosis not present

## 2022-12-08 DIAGNOSIS — F411 Generalized anxiety disorder: Secondary | ICD-10-CM | POA: Diagnosis not present

## 2022-12-08 DIAGNOSIS — F339 Major depressive disorder, recurrent, unspecified: Secondary | ICD-10-CM | POA: Diagnosis not present

## 2022-12-08 DIAGNOSIS — F4310 Post-traumatic stress disorder, unspecified: Secondary | ICD-10-CM | POA: Diagnosis not present

## 2022-12-15 DIAGNOSIS — F4310 Post-traumatic stress disorder, unspecified: Secondary | ICD-10-CM | POA: Diagnosis not present

## 2022-12-15 DIAGNOSIS — F339 Major depressive disorder, recurrent, unspecified: Secondary | ICD-10-CM | POA: Diagnosis not present

## 2022-12-15 DIAGNOSIS — F411 Generalized anxiety disorder: Secondary | ICD-10-CM | POA: Diagnosis not present

## 2022-12-29 DIAGNOSIS — F411 Generalized anxiety disorder: Secondary | ICD-10-CM | POA: Diagnosis not present

## 2022-12-29 DIAGNOSIS — F4310 Post-traumatic stress disorder, unspecified: Secondary | ICD-10-CM | POA: Diagnosis not present

## 2022-12-29 DIAGNOSIS — F339 Major depressive disorder, recurrent, unspecified: Secondary | ICD-10-CM | POA: Diagnosis not present

## 2022-12-30 ENCOUNTER — Other Ambulatory Visit (HOSPITAL_COMMUNITY): Payer: Self-pay

## 2022-12-30 MED ORDER — AMOXICILLIN 500 MG PO CAPS
500.0000 mg | ORAL_CAPSULE | Freq: Three times a day (TID) | ORAL | 0 refills | Status: DC
  Filled 2022-12-30: qty 21, 7d supply, fill #0

## 2022-12-30 MED ORDER — IBUPROFEN 800 MG PO TABS
800.0000 mg | ORAL_TABLET | Freq: Three times a day (TID) | ORAL | 0 refills | Status: DC | PRN
  Filled 2022-12-30: qty 30, 10d supply, fill #0

## 2022-12-30 MED ORDER — ACETAMINOPHEN-CODEINE 300-30 MG PO TABS
1.0000 | ORAL_TABLET | Freq: Four times a day (QID) | ORAL | 0 refills | Status: DC | PRN
  Filled 2022-12-30: qty 8, 2d supply, fill #0

## 2023-01-02 ENCOUNTER — Telehealth: Payer: Medicaid Other | Admitting: Physician Assistant

## 2023-01-02 ENCOUNTER — Ambulatory Visit
Admission: EM | Admit: 2023-01-02 | Discharge: 2023-01-02 | Disposition: A | Discharge status: Home / Self Care | Payer: Medicaid Other

## 2023-01-02 DIAGNOSIS — K0889 Other specified disorders of teeth and supporting structures: Secondary | ICD-10-CM | POA: Diagnosis not present

## 2023-01-02 NOTE — ED Provider Notes (Signed)
[facility]    CSN: [id number] Arrival date & time: 01/02/23  1536      History   Chief Complaint Chief Complaint  Patient presents with   Dental Pain    HPI Ivan Kramer is a 27 y.o. male.   HPI  He is in today for evaluation of his symptoms.  He reports that he fractured his wisdom tooth sometime ago.  He was seen at the emergency dentist on 12/10 where he was prescribed amoxicillin along with #3 and ibuprofen 800 mg.  He reports that he has been taking the medication as directed however continues to have pain.  He reports that he missed work today due to the pain.  He denies any fever, chills, headache or dizziness. 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   History reviewed. No pertinent surgical history.     Home Medications    Prior to Admission medications   Medication Sig Start Date End Date Taking? Authorizing Provider  amoxicillin (AMOXIL) 500 MG capsule Take 1 capsule (500 mg total) by mouth 3 (three) times daily until finished. 12/30/22  Yes   ibuprofen (ADVIL) 800 MG tablet Take 1 tablet (800 mg total) by mouth every 8 (eight) hours as needed for pain. 12/30/22  Yes   acetaminophen-codeine (TYLENOL #3) 300-30 MG tablet Take 1 tablet by mouth 4 (four) times daily as needed for pain. 12/30/22       Family History History reviewed. No pertinent family history.  Social History Social History   Tobacco Use   Smoking status: Never  Vaping Use   Vaping status: Former  Substance Use Topics   Alcohol use: No   Drug use: Yes    Frequency: 7.0 times per week    Types: Marijuana     Allergies   Lamotrigine   Review of Systems Review of Systems   Physical Exam Triage Vital Signs ED Triage Vitals  Encounter Vitals Group     BP 01/02/23 1547 (!) 144/81     Systolic BP Percentile --      Diastolic BP Percentile  --      Pulse Rate 01/02/23 1547 79     Resp 01/02/23 1547 19     Temp 01/02/23 1547 97.8 F (36.6 C)     Temp Source 01/02/23 1547 Oral     SpO2 01/02/23 1547 98 %     Weight --      Height --      Head Circumference --      Peak Flow --      Pain Score 01/02/23 1546 10     Pain Loc --      Pain Education --      Exclude from Growth Chart --    No data found.  Updated Vital Signs BP (!) 144/81 (BP Location: Right Arm)   Pulse 79   Temp 97.8 F (36.6 C) (Oral)   Resp 19   SpO2 98%   Visual Acuity Right Eye Distance:   Left Eye Distance:   Bilateral Distance:    Right Eye Near:   Left Eye Near:    Bilateral Near:     Physical Exam Constitutional:      Comments: Sleep when entered the room  HENT:     Head: Normocephalic.  Cardiovascular:  Rate and Rhythm: Normal rate.  Pulmonary:     Effort: Pulmonary effort is normal.  Skin:    General: Skin is warm and dry.     Capillary Refill: Capillary refill takes less than 2 seconds.  Neurological:     General: No focal deficit present.  Psychiatric:        Mood and Affect: Mood normal.      UC Treatments / Results  Labs (all labs ordered are listed, but only abnormal results are displayed) Labs Reviewed - No data to display  EKG   Radiology No results found.  Procedures Procedures (including critical care time)  Medications Ordered in UC Medications - No data to display  Initial Impression / Assessment and Plan / UC Course  I have reviewed the triage vital signs and the nursing notes.  Pertinent labs & imaging results that were available during my care of the patient were reviewed by me and considered in my medical decision making (see chart for details).     Dental pain Final Clinical Impressions(s) / UC Diagnoses   Final diagnoses:  Pain, dental     Discharge Instructions      You have declined change in Amoxicillin. There are no additional treatment options at this time. You have to  allow the Amoxicillin to work and you are encouraged to follow up with the dentist on Monday.  The recommendation is continue topical agents.      ED Prescriptions   None    PDMP not reviewed this encounter.   Thad Ranger [place], [place] 01/02/23 [phone]

## 2023-01-02 NOTE — ED Triage Notes (Signed)
Patient states that he went to the emergency dentist. He's scheduled for extraction of wisdom teeth on 12-20. Patient woke up in excruciating pain this morning. Top right.

## 2023-01-02 NOTE — Progress Notes (Signed)
Because of having uncontrolled pain despite amoxicillin for the infection and Ibuprofen for pain and inflammation, I feel your condition warrants further evaluation and I recommend that you be seen in a face to face visit.  We cannot prescribe any narcotics or controlled substances within any virtual appointment.  This includes tylenol that contain codeine.      NOTE: There will be NO CHARGE for this eVisit   If you are having a true medical emergency please call 911.      For an urgent face to face visit, [facility] has eight urgent care centers for your convenience:   NEW!! [facility] Health Urgent Care Center at [facility] Get Driving Directions [phone] [address] [place], [address]    [facility] Health Urgent Care Center at [facility] Get Driving Directions [phone] [address] [place], [place] [address]   [facility] Health Urgent Care Center [facility]) Get Driving Directions [phone] [address] [place], [place] [address]  [facility] Health Urgent Care Center [facility]) Get Driving Directions [phone] [address] [place],  [place]  [address]  [facility] Health Urgent Care Center [facility] - at [employer]  [phone] [address] [place],  [place] [address]   [facility] Health Urgent Care at [facility] Get Driving Directions [phone] [address] [place], [place] [address]   [facility] Health Urgent Care at [facility] Get Driving Directions  [phone] [address] [place], [place] [address]   [facility] Health Urgent Care at [facility] Directions [phone] [address] [place], [place] [address]  Your MyChart E-visit questionnaire answers were reviewed by a board certified advanced clinical practitioner to complete your personal care plan based on your specific symptoms.  Thank you for using  e-Visits.    I have spent 5 minutes in review of e-visit questionnaire, review and updating patient chart, medical decision making and response to patient.   Margaretann Loveless, PA-C

## 2023-01-02 NOTE — Discharge Instructions (Addendum)
You have declined change in Amoxicillin. There are no additional treatment options at this time. You have to allow the Amoxicillin to work and you are encouraged to follow up with the dentist on Monday.  The recommendation is continue topical agents.

## 2023-01-08 DIAGNOSIS — F339 Major depressive disorder, recurrent, unspecified: Secondary | ICD-10-CM | POA: Diagnosis not present

## 2023-01-08 DIAGNOSIS — F411 Generalized anxiety disorder: Secondary | ICD-10-CM | POA: Diagnosis not present

## 2023-01-08 DIAGNOSIS — F4310 Post-traumatic stress disorder, unspecified: Secondary | ICD-10-CM | POA: Diagnosis not present

## 2023-01-12 DIAGNOSIS — F410 Panic disorder [episodic paroxysmal anxiety] without agoraphobia: Secondary | ICD-10-CM | POA: Diagnosis not present

## 2023-01-23 DIAGNOSIS — F411 Generalized anxiety disorder: Secondary | ICD-10-CM | POA: Diagnosis not present

## 2023-01-23 DIAGNOSIS — F4310 Post-traumatic stress disorder, unspecified: Secondary | ICD-10-CM | POA: Diagnosis not present

## 2023-01-23 DIAGNOSIS — F339 Major depressive disorder, recurrent, unspecified: Secondary | ICD-10-CM | POA: Diagnosis not present

## 2023-01-26 DIAGNOSIS — F411 Generalized anxiety disorder: Secondary | ICD-10-CM | POA: Diagnosis not present

## 2023-01-26 DIAGNOSIS — F4310 Post-traumatic stress disorder, unspecified: Secondary | ICD-10-CM | POA: Diagnosis not present

## 2023-01-26 DIAGNOSIS — F339 Major depressive disorder, recurrent, unspecified: Secondary | ICD-10-CM | POA: Diagnosis not present

## 2023-02-02 DIAGNOSIS — F4310 Post-traumatic stress disorder, unspecified: Secondary | ICD-10-CM | POA: Diagnosis not present

## 2023-02-02 DIAGNOSIS — F339 Major depressive disorder, recurrent, unspecified: Secondary | ICD-10-CM | POA: Diagnosis not present

## 2023-02-02 DIAGNOSIS — F411 Generalized anxiety disorder: Secondary | ICD-10-CM | POA: Diagnosis not present

## 2023-02-03 ENCOUNTER — Telehealth: Payer: Medicaid Other | Admitting: Nurse Practitioner

## 2023-02-03 ENCOUNTER — Telehealth: Payer: Medicaid Other | Admitting: Physician Assistant

## 2023-02-03 DIAGNOSIS — K0889 Other specified disorders of teeth and supporting structures: Secondary | ICD-10-CM

## 2023-02-03 DIAGNOSIS — G8929 Other chronic pain: Secondary | ICD-10-CM

## 2023-02-03 MED ORDER — AMOXICILLIN-POT CLAVULANATE 875-125 MG PO TABS: 1.0000 | ORAL_TABLET | Freq: Two times a day (BID) | ORAL | 0 refills | Status: DC

## 2023-02-03 MED ORDER — AMOXICILLIN-POT CLAVULANATE 875-125 MG PO TABS: 1.0000 | ORAL_TABLET | Freq: Two times a day (BID) | ORAL | 0 refills | Status: AC

## 2023-02-03 NOTE — Progress Notes (Signed)
 Because you were seen for the same condition one month ago, I feel your condition warrants further evaluation and I recommend that you be seen in a face to face visit.   NOTE: There will be NO CHARGE for this E-Visit   If you are having a true medical emergency please call 911.     For an urgent face to face visit, [facility] has multiple urgent care centers for your convenience.   [facility] Your location to [facility] at [facility] [phone] [address] [place], [place] [address]   Urgent Care at [facility] [place], [place]  [place] Your location to [facility] Urgent Care at [facility] [phone] [address] [place],  [place]  [address]  [facility] Health Urgent Care at [facility]) Get Driving Directions [phone] [address] [place], [address]    [facility] Health Urgent Care Center at [facility] Get Driving Directions [phone] [address] [place], [place] [address]   [facility] Health Urgent [facility]) Get Driving Directions [phone] [address] [place], [place] [address]  [facility] Health Urgent Care Center [facility]) Get Driving Directions [phone] [address] [place],  [place]  [address]  [facility] Health Urgent Care Center [facility] - at [employer]  [phone] [address] [place],  [place] [address]   [facility] Health Urgent Care at [facility] Get Driving Directions [phone] [address] [place], [place] [address]   [facility] Health Urgent Care at [facility] Get Driving Directions  [phone] [address] [place], [place] [address]   [facility] Health Urgent Care at [facility] Directions [phone] [address] [place], [place] [address]  Your MyChart E-visit questionnaire answers were reviewed by a board certified advanced clinical practitioner to complete your personal care plan based on your specific symptoms.  Thank you for using e-Visits.

## 2023-02-03 NOTE — Progress Notes (Signed)
 Mr. Ivan, Kramer are scheduled for a virtual visit with your provider today.    Just as we do with appointments in the office, we must obtain your consent to participate.  Your consent will be active for this visit and any virtual visit you may have with one of our providers in the next 365 days.    If you have a MyChart account, I can also send a copy of this consent to you electronically.  All virtual visits are billed to your insurance company just like a traditional visit in the office.  As this is a virtual visit, video technology does not allow for your provider to perform a traditional examination.  This may limit your provider's ability to fully assess your condition.  If your provider identifies any concerns that need to be evaluated in person or the need to arrange testing such as labs, EKG, etc, we will make arrangements to do so.    Although advances in technology are sophisticated, we cannot ensure that it will always work on either your end or our end.  If the connection with a video visit is poor, we may have to switch to a telephone visit.  With either a video or telephone visit, we are not always able to ensure that we have a secure connection.   I need to obtain your verbal consent now.   Are you willing to proceed with your visit today?   Ivan Kramer has provided verbal consent on 02/03/2023 for a virtual visit (video or telephone).   Ivan GORMAN Snuffer, Ivan Kramer 02/03/2023  12:35 PM   Date:  02/03/2023   ID:  Ivan Kramer, DOB 03/01/1995, MRN [id number]  Patient Location: Home Provider Location: Home Office   Participants: Patient and Provider for Visit and Wrap up  Method of visit: Video  Location of Patient: Home Location of Provider: Home Office Consent was obtain for visit over the video. Services rendered by provider: Visit was performed via video  A video enabled telemedicine application was used and I verified that I am speaking with the correct person using two  identifiers.  PCP:  Okey Carlin Red[place], MD   Chief Complaint:  dental pain   History of Present Illness:    Ivan Kramer is a 28 y.o. male with history as stated below. Presents video telehealth for an acute care visit  Patient reports that the top right wisdom tooth was xrayed 1 month ago and he was told it was broken. He was given abx and his pain improved. Pt has had recurrence of pain again about 1 week ago. Denies fevers. Denies facial swelling, trismus, sublingual or submandibular swelling.   Past Medical, Surgical, Social History, Allergies, and Medications have been Reviewed.  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     Allergies:   Lamotrigine   ROS See HPI for history of present illness.  Physical Exam Constitutional:      Appearance: Normal appearance.  HENT:     Mouth/Throat:     Comments: No trismus, no submandibular swelling. No facial swelling. Neurological:     Mental Status: He is alert.               MDM: Pt with dental pain to fractured wisdom tooth.  Will cover with abx. He is planning to call his oral surgeon today to make an appt for extraction. Clinically there are no signs of a deep space infection though exam is limited secondary to virtual nature of visit.   Tests Ordered: No orders of the defined types were placed in this encounter.   Medication Changes: Meds ordered this encounter  Medications   amoxicillin -clavulanate (AUGMENTIN ) 875-125 MG tablet    Sig: Take 1 tablet by mouth 2 (two) times daily for 7 days.    Dispense:  14 tablet    Refill:  0     Disposition:  Follow up  Signed, Ivan GORMAN Snuffer, Ivan Kramer  02/03/2023 12:35 PM

## 2023-02-03 NOTE — Patient Instructions (Signed)
  Ivan Kramer, thank you for joining Lynden GORMAN Snuffer, PA-C for today's virtual visit.  While this provider is not your primary care provider (PCP), if your PCP is located in our provider database this encounter information will be shared with them immediately following your visit.   A [facility] MyChart account gives you access to today's visit and all your visits, tests, and labs performed at [facility]  click here if you don't have a [facility] MyChart account or go to [email or web address]  Consent: (Patient) Ivan Kramer provided verbal consent for this virtual visit at the beginning of the encounter.  Current Medications:  Current Outpatient Medications:    acetaminophen -codeine  (TYLENOL  #3) 300-30 MG tablet, Take 1 tablet by mouth 4 (four) times daily as needed for pain., Disp: 8 tablet, Rfl: 0   amoxicillin  (AMOXIL ) 500 MG capsule, Take 1 capsule (500 mg total) by mouth 3 (three) times daily until finished., Disp: 21 capsule, Rfl: 0   ibuprofen  (ADVIL ) 800 MG tablet, Take 1 tablet (800 mg total) by mouth every 8 (eight) hours as needed for pain., Disp: 30 tablet, Rfl: 0   Medications ordered in this encounter:  No orders of the defined types were placed in this encounter.    *If you need refills on other medications prior to your next appointment, please contact your pharmacy*  Follow-Up: Call back or seek an in-person evaluation if the symptoms worsen or if the condition fails to improve as anticipated.  [facility] [phone]  Other Instructions You were given a prescription for antibiotics. Please take the antibiotic prescription fully.   Please follow-up with a dentist in the next 5 to 7 days for reevaluation.  If your symptoms worsen you will need to seek an in person visit for further evaluation.   If you have been instructed to have an in-person evaluation today at a local Urgent Care facility, please use the link below. It will take  you to a list of all of our available [facility], including address, phone number and hours of operation. Please do not delay care.  [facility]  If you or a family member do not have a primary care provider, use the link below to schedule a visit and establish care. When you choose a [facility] primary care physician or advanced practice provider, you gain a long-term partner in health. Find a Primary Care Provider  Learn more about [facility]'s in-office and virtual care options: [facility] - Get Care Now

## 2023-02-09 DIAGNOSIS — F411 Generalized anxiety disorder: Secondary | ICD-10-CM | POA: Diagnosis not present

## 2023-02-09 DIAGNOSIS — F339 Major depressive disorder, recurrent, unspecified: Secondary | ICD-10-CM | POA: Diagnosis not present

## 2023-02-09 DIAGNOSIS — F4310 Post-traumatic stress disorder, unspecified: Secondary | ICD-10-CM | POA: Diagnosis not present

## 2023-02-14 ENCOUNTER — Telehealth: Payer: Medicaid Other

## 2023-02-14 DIAGNOSIS — K0889 Other specified disorders of teeth and supporting structures: Secondary | ICD-10-CM | POA: Diagnosis not present

## 2023-02-14 MED ORDER — IBUPROFEN 800 MG PO TABS
800.0000 mg | ORAL_TABLET | Freq: Three times a day (TID) | ORAL | 0 refills | Status: DC | PRN
Start: 2023-02-14 — End: 2023-06-14

## 2023-02-14 MED ORDER — CHLORHEXIDINE GLUCONATE 0.12 % MT SOLN: 15.0000 mL | Freq: Two times a day (BID) | OROMUCOSAL | 1 refills | Status: DC

## 2023-02-14 NOTE — Patient Instructions (Signed)
  Lyn Records, thank you for joining Claiborne Rigg, NP for today's virtual visit.  While this provider is not your primary care provider (PCP), if your PCP is located in our provider database this encounter information will be shared with them immediately following your visit.   A [facility] MyChart account gives you access to today's visit and all your visits, tests, and labs performed at [facility] " click here if you don't have a [facility] MyChart account or go to [email or web address]  Consent: (Patient) Ivan Kramer provided verbal consent for this virtual visit at the beginning of the encounter.  Current Medications:  Current Outpatient Medications:    chlorhexidine (PERIDEX) 0.12 % solution, Use as directed 15 mLs in the mouth or throat 2 (two) times daily., Disp: 500 mL, Rfl: 1   acetaminophen-codeine (TYLENOL #3) 300-30 MG tablet, Take 1 tablet by mouth 4 (four) times daily as needed for pain., Disp: 8 tablet, Rfl: 0   amoxicillin (AMOXIL) 500 MG capsule, Take 1 capsule (500 mg total) by mouth 3 (three) times daily until finished., Disp: 21 capsule, Rfl: 0   ibuprofen (ADVIL) 800 MG tablet, Take 1 tablet (800 mg total) by mouth every 8 (eight) hours as needed for pain., Disp: 30 tablet, Rfl: 0   Medications ordered in this encounter:  Meds ordered this encounter  Medications   chlorhexidine (PERIDEX) 0.12 % solution    Sig: Use as directed 15 mLs in the mouth or throat 2 (two) times daily.    Dispense:  500 mL    Refill:  1    Supervising Provider:   Merrilee Jansky [[id number]]   ibuprofen (ADVIL) 800 MG tablet    Sig: Take 1 tablet (800 mg total) by mouth every 8 (eight) hours as needed for pain.    Dispense:  30 tablet    Refill:  0    Supervising Provider:   Merrilee Jansky [[id number]]     *If you need refills on other medications prior to your next appointment, please contact your pharmacy*  Follow-Up: Call back or seek an in-person evaluation if the  symptoms worsen or if the condition fails to improve as anticipated.  [facility] [phone]  Other Instructions Call [facility] to schedule an appt.    If you have been instructed to have an in-person evaluation today at a local Urgent Care facility, please use the link below. It will take you to a list of all of our available [facility], including address, phone number and hours of operation. Please do not delay care.  [facility]  If you or a family member do not have a primary care provider, use the link below to schedule a visit and establish care. When you choose a [facility] primary care physician or advanced practice provider, you gain a long-term partner in health. Find a Primary Care Provider  Learn more about [facility] in-office and virtual care options: [facility] - Get Care Now

## 2023-02-14 NOTE — Progress Notes (Signed)
Virtual Visit Consent   Ivan Kramer, you are scheduled for a virtual visit with a  provider today. Just as with appointments in the office, your consent must be obtained to participate. Your consent will be active for this visit and any virtual visit you may have with one of our providers in the next 365 days. If you have a MyChart account, a copy of this consent can be sent to you electronically.  As this is a virtual visit, video technology does not allow for your provider to perform a traditional examination. This may limit your provider's ability to fully assess your condition. If your provider identifies any concerns that need to be evaluated in person or the need to arrange testing (such as labs, EKG, etc.), we will make arrangements to do so. Although advances in technology are sophisticated, we cannot ensure that it will always work on either your end or our end. If the connection with a video visit is poor, the visit may have to be switched to a telephone visit. With either a video or telephone visit, we are not always able to ensure that we have a secure connection.  By engaging in this virtual visit, you consent to the provision of healthcare and authorize for your insurance to be billed (if applicable) for the services provided during this visit. Depending on your insurance coverage, you may receive a charge related to this service.  I need to obtain your verbal consent now. Are you willing to proceed with your visit today? Ivan Kramer has provided verbal consent on 02/14/2023 for a virtual visit (video or telephone). Claiborne Rigg, NP  Date: 02/14/2023 12:02 PM  Virtual Visit via Video Note   I, Claiborne Rigg, connected with  Ivan Kramer  ([id number], August 02, 1995) on 02/14/23 at 12:00 PM EST by a video-enabled telemedicine application and verified that I am speaking with the correct person using two identifiers.  Location: Patient: Virtual Visit Location Patient:  Home Provider: Virtual Visit Location Provider: Home Office   I discussed the limitations of evaluation and management by telemedicine and the availability of in person appointments. The patient expressed understanding and agreed to proceed.    History of Present Illness: Ivan Kramer is a 28 y.o. who identifies as a male who was assigned male at birth, and is being seen today for dental pain.   Rigdon has a right top molar that needs to be extracted. He has been experiencing pain in this area as well. Antibiotics have helped in the past. He most recently was prescribed abx for dental infection in December and on 02-03-2023. States he was drinking alcohol with his last round of abx and suspects the augmentin may have lost some of its effectiveness due to his drinking as his pain has returned    Problems: There are no active problems to display for this patient.   Allergies:  Allergies  Allergen Reactions   Lamotrigine Rash   Medications:  Current Outpatient Medications:    chlorhexidine (PERIDEX) 0.12 % solution, Use as directed 15 mLs in the mouth or throat 2 (two) times daily., Disp: 500 mL, Rfl: 1   acetaminophen-codeine (TYLENOL #3) 300-30 MG tablet, Take 1 tablet by mouth 4 (four) times daily as needed for pain., Disp: 8 tablet, Rfl: 0   amoxicillin (AMOXIL) 500 MG capsule, Take 1 capsule (500 mg total) by mouth 3 (three) times daily until finished., Disp: 21 capsule, Rfl: 0   ibuprofen (ADVIL) 800 MG tablet, Take 1  tablet (800 mg total) by mouth every 8 (eight) hours as needed for pain., Disp: 30 tablet, Rfl: 0  Observations/Objective: Patient is well-developed, well-nourished in no acute distress.  Resting comfortably at home.  Head is normocephalic, atraumatic.  No labored breathing.  Speech is clear and coherent with logical content.  Patient is alert and oriented at baseline.    Assessment and Plan: 1. Pain, dental (Primary) - chlorhexidine (PERIDEX) 0.12 % solution; Use as  directed 15 mLs in the mouth or throat 2 (two) times daily.  Dispense: 500 mL; Refill: 1 - ibuprofen (ADVIL) 800 MG tablet; Take 1 tablet (800 mg total) by mouth every 8 (eight) hours as needed for pain.  Dispense: 30 tablet; Refill: 0  Call [facility] to schedule an appt.    Follow Up Instructions: I discussed the assessment and treatment plan with the patient. The patient was provided an opportunity to ask questions and all were answered. The patient agreed with the plan and demonstrated an understanding of the instructions.  A copy of instructions were sent to the patient via MyChart unless otherwise noted below.    The patient was advised to call back or seek an in-person evaluation if the symptoms worsen or if the condition fails to improve as anticipated.    Claiborne Rigg, NP

## 2023-02-16 DIAGNOSIS — F4310 Post-traumatic stress disorder, unspecified: Secondary | ICD-10-CM | POA: Diagnosis not present

## 2023-02-16 DIAGNOSIS — F339 Major depressive disorder, recurrent, unspecified: Secondary | ICD-10-CM | POA: Diagnosis not present

## 2023-02-16 DIAGNOSIS — F411 Generalized anxiety disorder: Secondary | ICD-10-CM | POA: Diagnosis not present

## 2023-02-21 ENCOUNTER — Telehealth: Payer: Medicaid Other

## 2023-02-21 DIAGNOSIS — G8929 Other chronic pain: Secondary | ICD-10-CM

## 2023-02-21 DIAGNOSIS — K089 Disorder of teeth and supporting structures, unspecified: Secondary | ICD-10-CM | POA: Diagnosis not present

## 2023-02-21 NOTE — Patient Instructions (Signed)
Lyn Records, thank you for joining Reed Pandy, PA-C for today's virtual visit.  While this provider is not your primary care provider (PCP), if your PCP is located in our provider database this encounter information will be shared with them immediately following your visit.   A [facility] MyChart account gives you access to today's visit and all your visits, tests, and labs performed at [facility] " click here if you don't have a [facility] MyChart account or go to [email or web address]  Consent: (Patient) Ivan Kramer provided verbal consent for this virtual visit at the beginning of the encounter.  Current Medications:  Current Outpatient Medications:    acetaminophen-codeine (TYLENOL #3) 300-30 MG tablet, Take 1 tablet by mouth 4 (four) times daily as needed for pain., Disp: 8 tablet, Rfl: 0   amoxicillin (AMOXIL) 500 MG capsule, Take 1 capsule (500 mg total) by mouth 3 (three) times daily until finished., Disp: 21 capsule, Rfl: 0   chlorhexidine (PERIDEX) 0.12 % solution, Use as directed 15 mLs in the mouth or throat 2 (two) times daily., Disp: 500 mL, Rfl: 1   ibuprofen (ADVIL) 800 MG tablet, Take 1 tablet (800 mg total) by mouth every 8 (eight) hours as needed for pain., Disp: 30 tablet, Rfl: 0   Medications ordered in this encounter:  No orders of the defined types were placed in this encounter.    *If you need refills on other medications prior to your next appointment, please contact your pharmacy*  Follow-Up: Call back or seek an in-person evaluation if the symptoms worsen or if the condition fails to improve as anticipated.  [facility] [phone]  Other Instructions Dental Pain: What It Means  Dental pain is often a sign that something is wrong with your teeth or gums. You can also have pain after a dental treatment. If you have dental pain, it's important to contact your dental care provider, especially if you don't know what's causing  the pain.  Dental pain may hurt a lot or a little. It can be caused by many things, including: Tooth decay. This is also called cavities or caries. Infection. An abscess. This is when the inner part of the tooth is filled with pus. An injury or a crack in the tooth. Gum disease or gums that move back and expose the root of a tooth. Abnormal grinding or clenching of teeth. Not taking good care of your teeth. Sometimes the cause of pain isn't known. You may have pain all the time, or it may come and go. It may happen only when you're: Chewing. Exposed to hot or cold temperatures. Eating or drinking foods or drinks with a lot of sugar in them, such as soda or candy. Follow these instructions at home: Medicines Take your medicines only as told. If you were given antibiotics, take them as told. Do not stop taking them even if you start to feel better. Eating and drinking Avoid foods or drinks that cause you pain. These may include: Very hot or very cold foods or drinks. Sweet or sugary foods or drinks. Managing pain and swelling  Use ice or an ice pack on the painful area of your face as told. Put ice in a plastic bag. Place a towel between your skin and the ice. Leave the ice on for 20 minutes, 2-3 times a day. Remove the ice if your skin turns bright red. This is very important. If you cannot feel pain, heat, or cold, you have a  greater risk of damage to the area. If your skin turns red, take off the ice right away to prevent skin damage. The risk of damage is higher if you can't feel pain, heat, or cold. Brushing and flossing Brush your teeth twice a day using a fluoride toothpaste. Use a toothpaste made for sensitive teeth as told by your dental care provider. Always use a soft toothbrush. This will help prevent irritation of your gums. Floss your teeth at least once a day. General instructions Do not apply heat to the outside of your face. To cleanse your mouth and help with any  irritation and swelling in the painful area, you can try rinsing with salt water. Swish and gargle with salt water and then spit it out. To make salt water, add a half to a whole spoonful of salt to a glass of warm water. Mix well. You can repeat this every 2-3 hours for pain. Contact a dental care provider if: You have dental pain and you don't know why. Your pain isn't controlled with medicines. Your symptoms get worse. You have new symptoms. Get help right away if: You can't open your mouth. You're having trouble breathing or swallowing. You have a fever. Your face, neck, or jaw is swollen. These symptoms may be an emergency. Call 911 right away. Do not wait to see if the symptoms will go away. Do not drive yourself to the hospital. This information is not intended to replace advice given to you by your health care provider. Make sure you discuss any questions you have with your health care provider. Document Revised: 11/18/2022 Document Reviewed: 06/05/2022 Elsevier Patient Education  2024 Elsevier Inc.   If you have been instructed to have an in-person evaluation today at a local Urgent Care facility, please use the link below. It will take you to a list of all of our available [facility], including address, phone number and hours of operation. Please do not delay care.  [facility]  If you or a family member do not have a primary care provider, use the link below to schedule a visit and establish care. When you choose a [facility] primary care physician or advanced practice provider, you gain a long-term partner in health. Find a Primary Care Provider  Learn more about [facility] in-office and virtual care options:  - Get Care Now

## 2023-02-21 NOTE — Progress Notes (Signed)
Virtual Visit Consent   [name], you are scheduled for a virtual visit with a [facility] provider today. Just as with appointments in the office, your consent must be obtained to participate. Your consent will be active for this visit and any virtual visit you may have with one of our providers in the next 365 days. If you have a MyChart account, a copy of this consent can be sent to you electronically.  As this is a virtual visit, video technology does not allow for your provider to perform a traditional examination. This may limit your provider's ability to fully assess your condition. If your provider identifies any concerns that need to be evaluated in person or the need to arrange testing (such as labs, EKG, etc.), we will make arrangements to do so. Although advances in technology are sophisticated, we cannot ensure that it will always work on either your end or our end. If the connection with a video visit is poor, the visit may have to be switched to a telephone visit. With either a video or telephone visit, we are not always able to ensure that we have a secure connection.  By engaging in this virtual visit, you consent to the provision of healthcare and authorize for your insurance to be billed (if applicable) for the services provided during this visit. Depending on your insurance coverage, you may receive a charge related to this service.  I need to obtain your verbal consent now. Are you willing to proceed with your visit today? Ivan Kramer has provided verbal consent on 02/21/2023 for a virtual visit (video or telephone). Reed Pandy, [place]  Date: 02/21/2023 11:04 AM  Virtual Visit via Video Note   I, Reed Pandy, connected with  Ivan Kramer  ([id number], 1995/10/16) on 02/21/23 at 11:00 AM EST by a video-enabled telemedicine application and verified that I am speaking with the correct person using two identifiers.  Location: Patient: Virtual Visit Location Patient: Home Provider:  Virtual Visit Location Provider: Home Office   I discussed the limitations of evaluation and management by telemedicine and the availability of in person appointments. The patient expressed understanding and agreed to proceed.    History of Present Illness: Ivan Kramer is a 28 y.o. who identifies as a male who was assigned male at birth, and is being seen today for c/o on going problem and wants to know the next steps. Pt had an x-ray two months ago and wisdom tooth is broken in half.  Pt states having a hard time finding an oral surgeon that takes medicaid. Pt states using the oral solution but only helps for a few hours but he has a lot of pain. Pt states he did not go in to work today because of the dental pain. Pt states wants advice as to what are his next steps.   HPI: HPI  Problems: There are no active problems to display for this patient.   Allergies:  Allergies  Allergen Reactions   Lamotrigine Rash   Medications:  Current Outpatient Medications:    acetaminophen-codeine (TYLENOL #3) 300-30 MG tablet, Take 1 tablet by mouth 4 (four) times daily as needed for pain., Disp: 8 tablet, Rfl: 0   amoxicillin (AMOXIL) 500 MG capsule, Take 1 capsule (500 mg total) by mouth 3 (three) times daily until finished., Disp: 21 capsule, Rfl: 0   chlorhexidine (PERIDEX) 0.12 % solution, Use as directed 15 mLs in the mouth or throat 2 (two) times daily., Disp: 500 mL, Rfl: 1  ibuprofen (ADVIL) 800 MG tablet, Take 1 tablet (800 mg total) by mouth every 8 (eight) hours as needed for pain., Disp: 30 tablet, Rfl: 0  Observations/Objective: Patient is well-developed, well-nourished in no acute distress.  Resting comfortably at home.  Head is normocephalic, atraumatic.  No labored breathing.  Speech is clear and coherent with logical content.  Patient is alert and oriented at baseline.    Assessment and Plan: 1. Chronic dental pain (Primary) -Advised Pt next steps are to make an appointment with a  dentist as soon as poosible  -Pt to continue previously prescribed medications and dental rinses.  -Pt verbalized understanding and stated he would call and make appointment -Pt requests a work note for today   Follow Up Instructions: I discussed the assessment and treatment plan with the patient. The patient was provided an opportunity to ask questions and all were answered. The patient agreed with the plan and demonstrated an understanding of the instructions.  A copy of instructions were sent to the patient via MyChart unless otherwise noted below.    The patient was advised to call back or seek an in-person evaluation if the symptoms worsen or if the condition fails to improve as anticipated.    Reed Pandy, PA-C

## 2023-02-27 DIAGNOSIS — F339 Major depressive disorder, recurrent, unspecified: Secondary | ICD-10-CM | POA: Diagnosis not present

## 2023-02-27 DIAGNOSIS — F4310 Post-traumatic stress disorder, unspecified: Secondary | ICD-10-CM | POA: Diagnosis not present

## 2023-02-27 DIAGNOSIS — F411 Generalized anxiety disorder: Secondary | ICD-10-CM | POA: Diagnosis not present

## 2023-03-03 DIAGNOSIS — R0602 Shortness of breath: Secondary | ICD-10-CM | POA: Diagnosis not present

## 2023-03-03 DIAGNOSIS — R1031 Right lower quadrant pain: Secondary | ICD-10-CM | POA: Diagnosis not present

## 2023-03-03 DIAGNOSIS — R42 Dizziness and giddiness: Secondary | ICD-10-CM | POA: Diagnosis not present

## 2023-03-05 DIAGNOSIS — F4310 Post-traumatic stress disorder, unspecified: Secondary | ICD-10-CM | POA: Diagnosis not present

## 2023-03-05 DIAGNOSIS — F339 Major depressive disorder, recurrent, unspecified: Secondary | ICD-10-CM | POA: Diagnosis not present

## 2023-03-05 DIAGNOSIS — F411 Generalized anxiety disorder: Secondary | ICD-10-CM | POA: Diagnosis not present

## 2023-03-09 DIAGNOSIS — F4310 Post-traumatic stress disorder, unspecified: Secondary | ICD-10-CM | POA: Diagnosis not present

## 2023-03-09 DIAGNOSIS — F411 Generalized anxiety disorder: Secondary | ICD-10-CM | POA: Diagnosis not present

## 2023-03-09 DIAGNOSIS — F339 Major depressive disorder, recurrent, unspecified: Secondary | ICD-10-CM | POA: Diagnosis not present

## 2023-03-17 DIAGNOSIS — F339 Major depressive disorder, recurrent, unspecified: Secondary | ICD-10-CM | POA: Diagnosis not present

## 2023-03-17 DIAGNOSIS — F4310 Post-traumatic stress disorder, unspecified: Secondary | ICD-10-CM | POA: Diagnosis not present

## 2023-03-17 DIAGNOSIS — F411 Generalized anxiety disorder: Secondary | ICD-10-CM | POA: Diagnosis not present

## 2023-03-19 DIAGNOSIS — F410 Panic disorder [episodic paroxysmal anxiety] without agoraphobia: Secondary | ICD-10-CM | POA: Diagnosis not present

## 2023-03-23 DIAGNOSIS — F339 Major depressive disorder, recurrent, unspecified: Secondary | ICD-10-CM | POA: Diagnosis not present

## 2023-03-23 DIAGNOSIS — F411 Generalized anxiety disorder: Secondary | ICD-10-CM | POA: Diagnosis not present

## 2023-03-23 DIAGNOSIS — F4310 Post-traumatic stress disorder, unspecified: Secondary | ICD-10-CM | POA: Diagnosis not present

## 2023-03-23 DIAGNOSIS — F410 Panic disorder [episodic paroxysmal anxiety] without agoraphobia: Secondary | ICD-10-CM | POA: Diagnosis not present

## 2023-03-31 DIAGNOSIS — F410 Panic disorder [episodic paroxysmal anxiety] without agoraphobia: Secondary | ICD-10-CM | POA: Diagnosis not present

## 2023-04-06 DIAGNOSIS — F4310 Post-traumatic stress disorder, unspecified: Secondary | ICD-10-CM | POA: Diagnosis not present

## 2023-04-06 DIAGNOSIS — F411 Generalized anxiety disorder: Secondary | ICD-10-CM | POA: Diagnosis not present

## 2023-04-06 DIAGNOSIS — F339 Major depressive disorder, recurrent, unspecified: Secondary | ICD-10-CM | POA: Diagnosis not present

## 2023-04-13 DIAGNOSIS — F339 Major depressive disorder, recurrent, unspecified: Secondary | ICD-10-CM | POA: Diagnosis not present

## 2023-04-13 DIAGNOSIS — F411 Generalized anxiety disorder: Secondary | ICD-10-CM | POA: Diagnosis not present

## 2023-04-13 DIAGNOSIS — F4310 Post-traumatic stress disorder, unspecified: Secondary | ICD-10-CM | POA: Diagnosis not present

## 2023-04-17 DIAGNOSIS — R5383 Other fatigue: Secondary | ICD-10-CM | POA: Diagnosis not present

## 2023-04-17 DIAGNOSIS — F419 Anxiety disorder, unspecified: Secondary | ICD-10-CM | POA: Diagnosis not present

## 2023-04-17 DIAGNOSIS — Z13220 Encounter for screening for lipoid disorders: Secondary | ICD-10-CM | POA: Diagnosis not present

## 2023-04-20 DIAGNOSIS — F4310 Post-traumatic stress disorder, unspecified: Secondary | ICD-10-CM | POA: Diagnosis not present

## 2023-04-20 DIAGNOSIS — F411 Generalized anxiety disorder: Secondary | ICD-10-CM | POA: Diagnosis not present

## 2023-04-20 DIAGNOSIS — F339 Major depressive disorder, recurrent, unspecified: Secondary | ICD-10-CM | POA: Diagnosis not present

## 2023-04-27 DIAGNOSIS — F339 Major depressive disorder, recurrent, unspecified: Secondary | ICD-10-CM | POA: Diagnosis not present

## 2023-04-27 DIAGNOSIS — F4310 Post-traumatic stress disorder, unspecified: Secondary | ICD-10-CM | POA: Diagnosis not present

## 2023-04-27 DIAGNOSIS — F411 Generalized anxiety disorder: Secondary | ICD-10-CM | POA: Diagnosis not present

## 2023-05-04 DIAGNOSIS — F339 Major depressive disorder, recurrent, unspecified: Secondary | ICD-10-CM | POA: Diagnosis not present

## 2023-05-04 DIAGNOSIS — F411 Generalized anxiety disorder: Secondary | ICD-10-CM | POA: Diagnosis not present

## 2023-05-04 DIAGNOSIS — F4310 Post-traumatic stress disorder, unspecified: Secondary | ICD-10-CM | POA: Diagnosis not present

## 2023-05-18 DIAGNOSIS — F339 Major depressive disorder, recurrent, unspecified: Secondary | ICD-10-CM | POA: Diagnosis not present

## 2023-05-18 DIAGNOSIS — F411 Generalized anxiety disorder: Secondary | ICD-10-CM | POA: Diagnosis not present

## 2023-05-18 DIAGNOSIS — F4310 Post-traumatic stress disorder, unspecified: Secondary | ICD-10-CM | POA: Diagnosis not present

## 2023-06-04 DIAGNOSIS — F4310 Post-traumatic stress disorder, unspecified: Secondary | ICD-10-CM | POA: Diagnosis not present

## 2023-06-04 DIAGNOSIS — F411 Generalized anxiety disorder: Secondary | ICD-10-CM | POA: Diagnosis not present

## 2023-06-04 DIAGNOSIS — F339 Major depressive disorder, recurrent, unspecified: Secondary | ICD-10-CM | POA: Diagnosis not present

## 2023-06-08 DIAGNOSIS — F4310 Post-traumatic stress disorder, unspecified: Secondary | ICD-10-CM | POA: Diagnosis not present

## 2023-06-08 DIAGNOSIS — F339 Major depressive disorder, recurrent, unspecified: Secondary | ICD-10-CM | POA: Diagnosis not present

## 2023-06-08 DIAGNOSIS — F411 Generalized anxiety disorder: Secondary | ICD-10-CM | POA: Diagnosis not present

## 2023-06-14 ENCOUNTER — Telehealth: Admitting: Emergency Medicine

## 2023-06-14 DIAGNOSIS — K0889 Other specified disorders of teeth and supporting structures: Secondary | ICD-10-CM | POA: Diagnosis not present

## 2023-06-14 MED ORDER — IBUPROFEN 800 MG PO TABS
800.0000 mg | ORAL_TABLET | Freq: Three times a day (TID) | ORAL | 0 refills | Status: AC | PRN
Start: 2023-06-14 — End: ?

## 2023-06-14 MED ORDER — AMOXICILLIN-POT CLAVULANATE 875-125 MG PO TABS: 1.0000 | ORAL_TABLET | Freq: Two times a day (BID) | ORAL | 0 refills | Status: DC

## 2023-06-14 NOTE — Patient Instructions (Signed)
 Ivan Kramer, thank you for joining Ivan Burger, NP for today's virtual visit.  While this provider is not your primary care provider (PCP), if your PCP is located in our provider database this encounter information will be shared with them immediately following your visit.   A [facility] MyChart account gives you access to today's visit and all your visits, tests, and labs performed at [facility] " click here if you don't have a [facility] MyChart account or go to [email or web address]  Consent: (Patient) Ivan Kramer provided verbal consent for this virtual visit at the beginning of the encounter.  Current Medications:  Current Outpatient Medications:    amoxicillin -clavulanate (AUGMENTIN ) 875-125 MG tablet, Take 1 tablet by mouth 2 (two) times daily., Disp: 14 tablet, Rfl: 0   acetaminophen -codeine  (TYLENOL  #3) 300-30 MG tablet, Take 1 tablet by mouth 4 (four) times daily as needed for pain., Disp: 8 tablet, Rfl: 0   chlorhexidine  (PERIDEX ) 0.12 % solution, Use as directed 15 mLs in the mouth or throat 2 (two) times daily., Disp: 500 mL, Rfl: 1   ibuprofen  (ADVIL ) 800 MG tablet, Take 1 tablet (800 mg total) by mouth every 8 (eight) hours as needed for pain., Disp: 30 tablet, Rfl: 0   Medications ordered in this encounter:  Meds ordered this encounter  Medications   amoxicillin -clavulanate (AUGMENTIN ) 875-125 MG tablet    Sig: Take 1 tablet by mouth 2 (two) times daily.    Dispense:  14 tablet    Refill:  0   ibuprofen  (ADVIL ) 800 MG tablet    Sig: Take 1 tablet (800 mg total) by mouth every 8 (eight) hours as needed for pain.    Dispense:  30 tablet    Refill:  0     *If you need refills on other medications prior to your next appointment, please contact your pharmacy*  Follow-Up: Call back or seek an in-person evaluation if the symptoms worsen or if the condition fails to improve as anticipated.   Virtual Care [phone]  Other  Instructions  Please follow up with a dentist very soon. This will keep happening.   Look up the [employer]  [employer]'s [facility] for free dental clinics. [email or web address]-[email or web address]  Get there early and be prepared to wait. [employer] and [employer] have Armed forces operational officer schools that provide low cost routine dental care.   [facility] [address] [place], [place], [place]?  [phone]   Other resources: [facility] [address] [place], [place] [phone]  Patients with Medicaid: [facility] [phone] [address]                                [phone] [address] [employer] Phone:  [phone]                                                  Phone:  [phone]  Dr. Jimmye Moulds [address] [phone]    [facility] [phone] extension [phone] [address]   [place] [phone] [address] [place].  Rescue mission [phone] extension [phone] [place], [place], [address]  First come first serve for the first 10 clients.  May do simple extractions only, no wisdom teeth or surgery.  You may try the second for Thursday of the month starting at 6:30 AM.  [facility] Dentistry You may call the school to see if they are still helping to provide dental care for emergent cases.  If unable to pay or uninsured, contact:  [facility] or [facility]. to become qualified for the adult dental clinic.  No matter what dental problem you have, it will not get better unless you get good dental care.  If the tooth is not taken care of, your symptoms will come back in time and you will be visiting us  again in the Urgent Care Center with a bad toothache.  So, see your dentist as soon as possible.  If you don't have a dentist, we can give you a list of dentists.  Sometimes the most cost effective treatment is removal of the tooth.  This can  be done very inexpensively through one of the low cost [employer], broadcasting/film/video such as the facility on [facility] in [place] [phone]).  The downside to this is that you will have one less tooth and this can effect your ability to chew.  Some other things that can be done for a dental infection include the following:  Rinse your mouth out with hot salt water (1/2 tsp of table salt and a pinch of baking soda in 8 oz of hot water).  You can do this every 2 or 3 hours. Avoid cold foods, beverages, and cold air.  This will make your symptoms worse. Sleep with your head elevated.  Sleeping flat will cause your gums and oral tissues to swell and make them hurt more.  You can sleep on several pillows.  Even better is to sleep in a recliner with your head higher than your heart. For mild to moderate pain, you can take Tylenol , ibuprofen , or Aleve. External application of heat by a heating pad, hot water bottle, or hot wet towel can help with pain and speed healing.  You can do this every 2 to 3 hours. Do not fall asleep on a heating pad since this can cause a burn.   Go to www.goodrx.com to look up your medications. This will give you a list of where you can find your prescriptions at the most affordable prices. Or ask the pharmacist what the cash price is, or if they have any other discount programs available to help make your medication more affordable. This can be less expensive than what you would pay with insurance.     If you have been instructed to have an in-person evaluation today at a local Urgent Care facility, please use the link below. It will take you to a list of all of our available [facility], including address, phone number and hours of operation. Please do not delay care.  [facility]  If you or a family member do not have a primary care provider, use the link below to schedule a visit and establish care. When you choose a [facility] primary care  physician or advanced practice provider, you gain a long-term partner in health. Find a Primary Care Provider  Learn more about [facility] in-office and virtual care options: [facility] - Get Care Now

## 2023-06-14 NOTE — Progress Notes (Signed)
 Virtual Visit Consent   Ivan Kramer, you are scheduled for a virtual visit with a [facility] provider today. Just as with appointments in the office, your consent must be obtained to participate. Your consent will be active for this visit and any virtual visit you may have with one of our providers in the next 365 days. If you have a MyChart account, a copy of this consent can be sent to you electronically.  As this is a virtual visit, video technology does not allow for your provider to perform a traditional examination. This may limit your provider's ability to fully assess your condition. If your provider identifies any concerns that need to be evaluated in person or the need to arrange testing (such as labs, EKG, etc.), we will make arrangements to do so. Although advances in technology are sophisticated, we cannot ensure that it will always work on either your end or our end. If the connection with a video visit is poor, the visit may have to be switched to a telephone visit. With either a video or telephone visit, we are not always able to ensure that we have a secure connection.  By engaging in this virtual visit, you consent to the provision of healthcare and authorize for your insurance to be billed (if applicable) for the services provided during this visit. Depending on your insurance coverage, you may receive a charge related to this service.  I need to obtain your verbal consent now. Are you willing to proceed with your visit today? Ivan Kramer has provided verbal consent on 06/14/2023 for a virtual visit (video or telephone). Blinda Burger, NP  Date: 06/14/2023 7:47 PM   Virtual Visit via Video Note   I, Blinda Burger, connected with  Ivan Kramer  ([id number], 1995/11/06) on 06/14/23 at  7:45 PM EDT by a video-enabled telemedicine application and verified that I am speaking with the correct person using two identifiers.  Location: Patient: Virtual Visit Location Patient:  Home Provider: Virtual Visit Location Provider: Home Office   I discussed the limitations of evaluation and management by telemedicine and the availability of in person appointments. The patient expressed understanding and agreed to proceed.    History of Present Illness: Ivan Kramer is a 28 y.o. who identifies as a male who was assigned male at birth, and is being seen today for dental pain.   Pain is so bad - plans to go to dentist as soon as he can. Has job training this week, isnt sure when he can go. Has had this problem for a long time but kept putting off getting help, afraid of having tooth pulled. Top R wisdom tooth cracked a long time ago, is current site of pain. Using tiger balm externally, excedrin, orajel.   HPI: HPI  Problems: There are no active problems to display for this patient.   Allergies:  Allergies  Allergen Reactions   Lamotrigine Rash   Medications:  Current Outpatient Medications:    amoxicillin -clavulanate (AUGMENTIN ) 875-125 MG tablet, Take 1 tablet by mouth 2 (two) times daily., Disp: 14 tablet, Rfl: 0   acetaminophen -codeine  (TYLENOL  #3) 300-30 MG tablet, Take 1 tablet by mouth 4 (four) times daily as needed for pain., Disp: 8 tablet, Rfl: 0   chlorhexidine  (PERIDEX ) 0.12 % solution, Use as directed 15 mLs in the mouth or throat 2 (two) times daily., Disp: 500 mL, Rfl: 1   ibuprofen  (ADVIL ) 800 MG tablet, Take 1 tablet (800 mg total) by mouth every 8 (  eight) hours as needed for pain., Disp: 30 tablet, Rfl: 0  Observations/Objective: Patient is well-developed, well-nourished in no acute distress.  Resting comfortably  at home.  Head is normocephalic, atraumatic.  No labored breathing.  Speech is clear and coherent with logical content.  Patient is alert and oriented at baseline.    Assessment and Plan: 1. Pain, dental - ibuprofen  (ADVIL ) 800 MG tablet; Take 1 tablet (800 mg total) by mouth every 8 (eight) hours as needed for pain.  Dispense: 30  tablet; Refill: 0  Rx augmentin   Needs dental care ASAP.   Follow Up Instructions: I discussed the assessment and treatment plan with the patient. The patient was provided an opportunity to ask questions and all were answered. The patient agreed with the plan and demonstrated an understanding of the instructions.  A copy of instructions were sent to the patient via MyChart unless otherwise noted below.   The patient was advised to call back or seek an in-person evaluation if the symptoms worsen or if the condition fails to improve as anticipated.    Blinda Burger, NP

## 2023-06-15 DIAGNOSIS — F4310 Post-traumatic stress disorder, unspecified: Secondary | ICD-10-CM | POA: Diagnosis not present

## 2023-06-15 DIAGNOSIS — F411 Generalized anxiety disorder: Secondary | ICD-10-CM | POA: Diagnosis not present

## 2023-06-15 DIAGNOSIS — F339 Major depressive disorder, recurrent, unspecified: Secondary | ICD-10-CM | POA: Diagnosis not present

## 2023-06-18 ENCOUNTER — Other Ambulatory Visit (HOSPITAL_COMMUNITY): Payer: Self-pay

## 2023-06-18 MED ORDER — ACETAMINOPHEN-CODEINE 300-30 MG PO TABS
1.0000 | ORAL_TABLET | Freq: Four times a day (QID) | ORAL | 0 refills | Status: DC | PRN
  Filled 2023-06-18: qty 8, 2d supply, fill #0

## 2023-07-06 ENCOUNTER — Emergency Department
Admission: EM | Admit: 2023-07-06 | Discharge: 2023-07-06 | Disposition: A | Discharge status: Other Acute Inpatient Hospital | Attending: Emergency Medicine | Admitting: Emergency Medicine

## 2023-07-06 ENCOUNTER — Other Ambulatory Visit: Payer: Self-pay

## 2023-07-06 ENCOUNTER — Other Ambulatory Visit (HOSPITAL_COMMUNITY)
Admission: EM | Admit: 2023-07-06 | Discharge: 2023-07-08 | Disposition: A | Discharge status: Home / Self Care | Attending: Psychiatry | Admitting: Psychiatry

## 2023-07-06 DIAGNOSIS — R45850 Homicidal ideations: Secondary | ICD-10-CM | POA: Insufficient documentation

## 2023-07-06 DIAGNOSIS — F39 Unspecified mood [affective] disorder: Secondary | ICD-10-CM | POA: Diagnosis present

## 2023-07-06 DIAGNOSIS — F321 Major depressive disorder, single episode, moderate: Secondary | ICD-10-CM

## 2023-07-06 DIAGNOSIS — F329 Major depressive disorder, single episode, unspecified: Secondary | ICD-10-CM | POA: Insufficient documentation

## 2023-07-06 DIAGNOSIS — F1210 Cannabis abuse, uncomplicated: Secondary | ICD-10-CM | POA: Diagnosis not present

## 2023-07-06 DIAGNOSIS — F4389 Other reactions to severe stress: Secondary | ICD-10-CM | POA: Diagnosis not present

## 2023-07-06 DIAGNOSIS — F439 Reaction to severe stress, unspecified: Secondary | ICD-10-CM | POA: Diagnosis not present

## 2023-07-06 DIAGNOSIS — F4310 Post-traumatic stress disorder, unspecified: Secondary | ICD-10-CM | POA: Diagnosis not present

## 2023-07-06 DIAGNOSIS — Z8669 Personal history of other diseases of the nervous system and sense organs: Secondary | ICD-10-CM | POA: Diagnosis not present

## 2023-07-06 DIAGNOSIS — F17200 Nicotine dependence, unspecified, uncomplicated: Secondary | ICD-10-CM

## 2023-07-06 DIAGNOSIS — R45851 Suicidal ideations: Secondary | ICD-10-CM | POA: Insufficient documentation

## 2023-07-06 DIAGNOSIS — Z8659 Personal history of other mental and behavioral disorders: Secondary | ICD-10-CM | POA: Insufficient documentation

## 2023-07-06 DIAGNOSIS — F32A Depression, unspecified: Secondary | ICD-10-CM | POA: Insufficient documentation

## 2023-07-06 DIAGNOSIS — R001 Bradycardia, unspecified: Secondary | ICD-10-CM | POA: Diagnosis not present

## 2023-07-06 DIAGNOSIS — F411 Generalized anxiety disorder: Secondary | ICD-10-CM | POA: Diagnosis not present

## 2023-07-06 DIAGNOSIS — F17290 Nicotine dependence, other tobacco product, uncomplicated: Secondary | ICD-10-CM | POA: Insufficient documentation

## 2023-07-06 LAB — URINE DRUG SCREEN, QUALITATIVE (ARMC ONLY)
Amphetamines, Ur Screen: NOT DETECTED
Barbiturates, Ur Screen: NOT DETECTED
Benzodiazepine, Ur Scrn: NOT DETECTED
Cannabinoid 50 Ng, Ur ~~LOC~~: POSITIVE — AB
Cocaine Metabolite,Ur ~~LOC~~: NOT DETECTED
MDMA (Ecstasy)Ur Screen: NOT DETECTED
Methadone Scn, Ur: NOT DETECTED
Opiate, Ur Screen: NOT DETECTED
Phencyclidine (PCP) Ur S: NOT DETECTED
Tricyclic, Ur Screen: NOT DETECTED

## 2023-07-06 LAB — COMPREHENSIVE METABOLIC PANEL WITH GFR
ALT: 26 U/L (ref 0–44)
AST: 24 U/L (ref 15–41)
Albumin: 4.7 g/dL (ref 3.5–5.0)
Alkaline Phosphatase: 68 U/L (ref 38–126)
Anion gap: 8 (ref 5–15)
BUN: 13 mg/dL (ref 6–20)
CO2: 27 mmol/L (ref 22–32)
Calcium: 9.4 mg/dL (ref 8.9–10.3)
Chloride: 108 mmol/L (ref 98–111)
Creatinine, Ser: 0.9 mg/dL (ref 0.61–1.24)
GFR, Estimated: >60 mL/min (ref >60)
Glucose, Bld: 67 mg/dL — ABNORMAL LOW (ref 70–99)
Potassium: 4 mmol/L (ref 3.5–5.1)
Sodium: 143 mmol/L (ref 135–145)
Total Bilirubin: 1.5 mg/dL — ABNORMAL HIGH (ref 0.0–1.2)
Total Protein: 7.7 g/dL (ref 6.5–8.1)

## 2023-07-06 LAB — CBC
HCT: 43.1 % (ref 39.0–52.0)
Hemoglobin: 15.1 g/dL (ref 13.0–17.0)
MCH: 32.1 pg (ref 26.0–34.0)
MCHC: 35 g/dL (ref 30.0–36.0)
MCV: 91.7 fL (ref 80.0–100.0)
Platelets: 240 10*3/uL (ref 150–400)
RBC: 4.7 MIL/uL (ref 4.22–5.81)
RDW: 11.7 % (ref 11.5–15.5)
WBC: 8.2 10*3/uL (ref 4.0–10.5)
nRBC: 0 % (ref 0.0–0.2)

## 2023-07-06 LAB — ETHANOL: Alcohol, Ethyl (B): <15 mg/dL (ref <15)

## 2023-07-06 MED ORDER — ACETAMINOPHEN 325 MG PO TABS: 650.0000 mg | ORAL_TABLET | Freq: Four times a day (QID) | ORAL | Status: DC | PRN

## 2023-07-06 MED ORDER — LORAZEPAM 2 MG/ML IJ SOLN: 2.0000 mg | Freq: Three times a day (TID) | INTRAMUSCULAR | Status: DC | PRN

## 2023-07-06 MED ORDER — HALOPERIDOL LACTATE 5 MG/ML IJ SOLN: 5.0000 mg | Freq: Three times a day (TID) | INTRAMUSCULAR | Status: DC | PRN

## 2023-07-06 MED ORDER — QUETIAPINE FUMARATE 25 MG PO TABS: 25.0000 mg | ORAL_TABLET | Freq: Two times a day (BID) | ORAL | Status: DC | PRN

## 2023-07-06 MED ORDER — HYDROXYZINE HCL 25 MG PO TABS
25.0000 mg | ORAL_TABLET | Freq: Three times a day (TID) | ORAL | Status: DC | PRN
  Administered 2023-07-07: 25 mg via ORAL
  Filled 2023-07-06: qty 1

## 2023-07-06 MED ORDER — DIPHENHYDRAMINE HCL 50 MG/ML IJ SOLN: 50.0000 mg | Freq: Three times a day (TID) | INTRAMUSCULAR | Status: DC | PRN

## 2023-07-06 MED ORDER — NICOTINE 14 MG/24HR TD PT24
14.0000 mg | MEDICATED_PATCH | Freq: Every day | TRANSDERMAL | Status: DC
  Administered 2023-07-07: 14 mg via TRANSDERMAL
  Filled 2023-07-06: qty 1

## 2023-07-06 MED ORDER — QUETIAPINE FUMARATE 25 MG PO TABS
25.0000 mg | ORAL_TABLET | Freq: Two times a day (BID) | ORAL | Status: DC | PRN
  Administered 2023-07-07 – 2023-07-08 (×2): 25 mg via ORAL
  Filled 2023-07-06 (×2): qty 1

## 2023-07-06 MED ORDER — ALUM & MAG HYDROXIDE-SIMETH 200-200-20 MG/5ML PO SUSP: 30.0000 mL | ORAL | Status: DC | PRN

## 2023-07-06 MED ORDER — HALOPERIDOL LACTATE 5 MG/ML IJ SOLN: 10.0000 mg | Freq: Three times a day (TID) | INTRAMUSCULAR | Status: DC | PRN

## 2023-07-06 MED ORDER — GABAPENTIN 300 MG PO CAPS: 300.0000 mg | ORAL_CAPSULE | Freq: Every day | ORAL | Status: DC

## 2023-07-06 MED ORDER — NICOTINE 14 MG/24HR TD PT24: 14.0000 mg | MEDICATED_PATCH | Freq: Every day | TRANSDERMAL | Status: DC

## 2023-07-06 MED ORDER — MAGNESIUM HYDROXIDE 400 MG/5ML PO SUSP: 30.0000 mL | Freq: Every day | ORAL | Status: DC | PRN

## 2023-07-06 MED ORDER — DIPHENHYDRAMINE HCL 50 MG PO CAPS
50.0000 mg | ORAL_CAPSULE | Freq: Three times a day (TID) | ORAL | Status: DC | PRN
  Administered 2023-07-06: 50 mg via ORAL
  Filled 2023-07-06: qty 1

## 2023-07-06 MED ORDER — LORAZEPAM 1 MG PO TABS
1.0000 mg | ORAL_TABLET | Freq: Once | ORAL | Status: AC
  Administered 2023-07-06: 1 mg via ORAL
  Filled 2023-07-06: qty 1

## 2023-07-06 MED ORDER — HALOPERIDOL 5 MG PO TABS
5.0000 mg | ORAL_TABLET | Freq: Three times a day (TID) | ORAL | Status: DC | PRN
  Administered 2023-07-06: 5 mg via ORAL
  Filled 2023-07-06: qty 1

## 2023-07-06 MED ORDER — GABAPENTIN 300 MG PO CAPS
300.0000 mg | ORAL_CAPSULE | Freq: Every day | ORAL | Status: DC
  Administered 2023-07-06 – 2023-07-07 (×2): 300 mg via ORAL
  Filled 2023-07-06 (×2): qty 1
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
## 2023-07-06 NOTE — ED Notes (Signed)
 Pt approached nurses station stating, I feel like I'm about to hit a wall or something. I can't get in touch with my girlfriend. I need something. Pt received Haldol and Benadryl for increased agitation. Will monitor for safety. Encouraged pt to read a book as distraction technique.
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
## 2023-07-06 NOTE — ED Notes (Signed)
 Pt Lunch Provided at bedside

## 2023-07-06 NOTE — ED Notes (Signed)
 Report given to Almetta Armor RN for pt admission.

## 2023-07-06 NOTE — ED Notes (Addendum)
 Pt is isolative to his room this shift only coming out to use the restroom. Pt denies SI/HI/AVH. Pt voiced no complaints to this Clinical research associate. Pt is med complaint and is safe on the unit at this time.

## 2023-07-06 NOTE — ED Notes (Signed)
 VOL Consent scanned into Epic by Unit Sec. A.Enoch as requested

## 2023-07-06 NOTE — ED Notes (Signed)
 Accepted to Facility Based Crisis
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
## 2023-07-06 NOTE — ED Notes (Signed)
vol/psych consult ordered/pending.. 

## 2023-07-06 NOTE — Group Note (Signed)
 Group Topic: Positive Affirmations  Group Date: 07/06/2023 Start Time: 2030 End Time: 2045 Facilitators: Soila Dunnings  Department: [facility]  Number of Participants: 4  Group Focus: relaxation, reminiscence, and self-esteem Treatment Modality:  Leisure Development Interventions utilized were leisure development Purpose: express feelings and express irrational fears  Name: Ivan Kramer Date of Birth: 01-07-1996  MR: [id number]    Level of Participation: pt did not attend group  Quality of Participation: pt did not attend group  Interactions with others: minimal  Mood/Affect: appropriate Triggers (if applicable): n/a Cognition: coherent/clear Progress: Moderate Response: pt declined attending group and requested to rest.  Plan: patient will be encouraged to attend group   Patients Problems:  Patient Active Problem List   Diagnosis Date Noted   Unspecified mood (affective) disorder (HCC) 07/06/2023   Trauma and stressor-related disorder 07/06/2023   Homicidal ideations 07/06/2023   History of seizure disorder 07/06/2023

## 2023-07-06 NOTE — ED Notes (Signed)
 Telepsych monitor set up in pt room and provider notified. Pt alert and pleasant sitting up in bed.
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
## 2023-07-07 ENCOUNTER — Encounter (HOSPITAL_COMMUNITY): Payer: Self-pay | Admitting: Psychiatry

## 2023-07-07 DIAGNOSIS — F1210 Cannabis abuse, uncomplicated: Secondary | ICD-10-CM | POA: Diagnosis not present

## 2023-07-07 DIAGNOSIS — F411 Generalized anxiety disorder: Secondary | ICD-10-CM | POA: Diagnosis not present

## 2023-07-07 DIAGNOSIS — F4310 Post-traumatic stress disorder, unspecified: Secondary | ICD-10-CM | POA: Diagnosis not present

## 2023-07-07 DIAGNOSIS — F329 Major depressive disorder, single episode, unspecified: Secondary | ICD-10-CM | POA: Diagnosis not present

## 2023-07-07 MED ORDER — QUETIAPINE FUMARATE ER 50 MG PO TB24
50.0000 mg | ORAL_TABLET | Freq: Every day | ORAL | Status: DC
Start: 2023-07-07 — End: 2023-07-08
  Administered 2023-07-07: 50 mg via ORAL
  Filled 2023-07-07: qty 1

## 2023-07-07 MED ORDER — NICOTINE 14 MG/24HR TD PT24
14.0000 mg | MEDICATED_PATCH | Freq: Every day | TRANSDERMAL | Status: DC
  Administered 2023-07-08: 14 mg via TRANSDERMAL
  Filled 2023-07-07: qty 1

## 2023-07-07 MED ORDER — QUETIAPINE FUMARATE 50 MG PO TABS: 50.0000 mg | ORAL_TABLET | Freq: Every day | ORAL | Status: DC

## 2023-07-07 NOTE — ED Notes (Signed)
 Pt sleeping in no acute distress. RR even and unlabored. Environment secured. Will continue to monitor for safety.
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
## 2023-07-07 NOTE — ED Notes (Signed)
 Pt approached nurses station requesting medication for increased anxiety. Pt states, it's driving me crazy that I can't get in touch with my girlfriend. I just hope she is okay. Support and encouragement provided. Pt given Vistaril or sx mgmt. Will continue to monitor for safety.

## 2023-07-07 NOTE — ED Notes (Signed)
 Patient resting quietly in bed with eyes closed with unlabored breathing. Q 15 minute safety checks remain in place.  Pt remains safe on the unit at this time.
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
## 2023-07-07 NOTE — ED Notes (Signed)
 Pt was provided dinner.

## 2023-07-07 NOTE — ED Notes (Signed)
 Pt received Seroquel for increased anxiety as pt stated Vistaril was ineffective in managing his sx. Will continue to monitor for safety.

## 2023-07-07 NOTE — ED Notes (Signed)
 Pt was provided lunch

## 2023-07-07 NOTE — ED Notes (Signed)
 Patient observed/assessed at bedside lying in bed asleep. Patient alert and oriented to self and location. Affect is flat and patient. Patient denies pain and anxiety. He denies A/V/H. He denies having any thoughts/plan of self harm and harm towards others. Fluid and snack offered. Patient states that appetite has been good throughout the day. Verbalizes no further complaints at this time.

## 2023-07-07 NOTE — Group Note (Signed)
 Group Topic: Communication  Group Date: 07/07/2023 Start Time: 0900 End Time: 0935 Facilitators: Denzil Flatten, RN  Department: [facility]  Number of Participants: 4  Group Focus: nursing group Treatment Modality:  Psychoeducation Interventions utilized were patient education Purpose: increase insight  Name: Ivan Kramer Date of Birth: [birth year]  MR: [id number]    Level of Participation: active Quality of Participation: cooperative Interactions with others: gave feedback Mood/Affect: appropriate Triggers (if applicable): none identified Cognition: coherent/clear Progress: Gaining insight Response: Pt verbalized understanding of indication of medication Gabapentin Plan: patient will be encouraged to notify staff with SE from medication reviewed by staff  Patients Problems:  Patient Active Problem List   Diagnosis Date Noted   Unspecified mood (affective) disorder (HCC) 07/06/2023   Trauma and stressor-related disorder 07/06/2023   Homicidal ideations 07/06/2023   History of seizure disorder 07/06/2023

## 2023-07-07 NOTE — ED Notes (Signed)
 Pt stated, I don't know if this is a placebo effect but I feel better already from that Seroquel. I think I'm gonna like this medicine. Praise given. Will continue to monitor for safety.

## 2023-07-07 NOTE — Group Note (Signed)
 Group Topic: Communication  Group Date: 07/07/2023 Start Time: 0800 End Time: 0830 Facilitators: Chipper Council, NT  Department: [facility]  Number of Participants: 8  Group Focus: check in and community group Treatment Modality:  Psychoeducation Interventions utilized were group exercise and orientation Purpose: express feelings, express irrational fears, improve communication skills, and reinforce self-care  Name: Joden Bonsall Date of Birth: May 27, 1995  MR: [id number]    Level of Participation: minimal Quality of Participation: restless and withdrawn Interactions with others: gave feedback Mood/Affect: anxious Triggers (if applicable): not being able to get in contact with his partner. Cognition: coherent/clear Progress: Gaining insight Response: Pt expressed how he is feeling today and expressed the need for advice on how to cope with these feelings. Plan: patient will be encouraged to attend future groups.  Patients Problems:  Patient Active Problem List   Diagnosis Date Noted   Unspecified mood (affective) disorder (HCC) 07/06/2023   Trauma and stressor-related disorder 07/06/2023   Homicidal ideations 07/06/2023   History of seizure disorder 07/06/2023

## 2023-07-07 NOTE — ED Notes (Signed)
 Pt currently on phone. No acute distress noted. No concerns voiced. Informed pt to notify staff with any needs or assistance. Pt verbalized understanding and agreement. Will continue to monitor for safety.

## 2023-07-07 NOTE — ED Notes (Signed)
 Patient A&Ox4. Denies intent to harm self/others when asked. Denies A/VH. Patient denies any physical complaints when asked. No acute distress noted. Support and encouragement provided. Routine safety checks conducted according to facility protocol. Encouraged patient to notify staff if thoughts of harm toward self or others arise. Patient verbalize understanding and agreement. Will continue to monitor for safety.

## 2023-07-08 DIAGNOSIS — F329 Major depressive disorder, single episode, unspecified: Secondary | ICD-10-CM | POA: Diagnosis not present

## 2023-07-08 DIAGNOSIS — F1210 Cannabis abuse, uncomplicated: Secondary | ICD-10-CM | POA: Diagnosis not present

## 2023-07-08 DIAGNOSIS — F4310 Post-traumatic stress disorder, unspecified: Secondary | ICD-10-CM | POA: Diagnosis not present

## 2023-07-08 DIAGNOSIS — F411 Generalized anxiety disorder: Secondary | ICD-10-CM | POA: Diagnosis not present

## 2023-07-08 LAB — LIPID PANEL
Cholesterol: 179 mg/dL (ref 0–200)
HDL: 39 mg/dL — ABNORMAL LOW (ref >40)
LDL Cholesterol: 127 mg/dL — ABNORMAL HIGH (ref 0–99)
Total CHOL/HDL Ratio: 4.6 ratio
Triglycerides: 64 mg/dL (ref <150)
VLDL: 13 mg/dL (ref 0–40)

## 2023-07-08 LAB — HEMOGLOBIN A1C
Hgb A1c MFr Bld: 4.6 % — ABNORMAL LOW (ref 4.8–5.6)
Mean Plasma Glucose: 85.32 mg/dL

## 2023-07-08 LAB — TSH: TSH: 1.524 u[IU]/mL (ref 0.350–4.500)

## 2023-07-08 MED ORDER — NICOTINE 14 MG/24HR TD PT24: 14.0000 mg | MEDICATED_PATCH | Freq: Every day | TRANSDERMAL | 0 refills | Status: AC

## 2023-07-08 MED ORDER — QUETIAPINE FUMARATE 25 MG PO TABS: 25.0000 mg | ORAL_TABLET | Freq: Two times a day (BID) | ORAL | 0 refills | Status: DC | PRN

## 2023-07-08 MED ORDER — QUETIAPINE FUMARATE ER 50 MG PO TB24: 50.0000 mg | ORAL_TABLET | Freq: Every day | ORAL | 0 refills | Status: DC
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
## 2023-07-08 NOTE — ED Notes (Signed)
 Patient is sleeping. Respirations equal and unlabored. No change in assessment or acuity. Routine safety checks conducted according to facility protocol.

## 2023-07-08 NOTE — Discharge Planning (Signed)
 SW made call to [facility] to arrange follow up appointment and the scheduler is to call this SW back after 12. Patient was made aware and able to schedule the follow up appointments with psychiatry and therapy if these are not able to be made by this SW. Per MD, patient is stable for discharge today and cab voucher will be provided to patient for transport home.

## 2023-07-08 NOTE — Discharge Instructions (Addendum)
 Based on the information you have provided and the presenting issue, outpatient services and resources have been recommended.  It is imperative that you follow through with treatment recommendations within 5-7 days from the of discharge to mitigate further risk to your safety and mental well-being. In case of an urgent crisis, you may contact the Mobile Crisis Unit with [facility] at [phone].   [facility] [address] [place], [place] [address] [phone]

## 2023-07-08 NOTE — Group Note (Signed)
 Group Topic: Social Support  Group Date: 07/08/2023 Start Time: 1000 End Time: 1030 Facilitators: Esther Hem, NT  Department: [facility]  Number of Participants: 6  Group Focus: social skills Treatment Modality:  Psychoeducation Interventions utilized were support Purpose: increase insight  Name: Ivan Kramer Date of Birth: 1995/08/22  MR: [id number]    Level of Participation: active Quality of Participation: attentive, cooperative, and engaged Interactions with others: gave feedback Mood/Affect: appropriate Triggers (if applicable): n/a Cognition: coherent/clear Progress: Gaining insight Response: PT threw the football around with other Pts and mentioned how he was worried about his fiance. Plan: patient will be encouraged to attend groups  Patients Problems:  Patient Active Problem List   Diagnosis Date Noted   Unspecified mood (affective) disorder (HCC) 07/06/2023   Trauma and stressor-related disorder 07/06/2023   Homicidal ideations 07/06/2023   History of seizure disorder 07/06/2023

## 2023-07-08 NOTE — ED Notes (Signed)
 Patient is awake and alert sitting in the dayroom talking with peer.  No distress or complaint at this time.  Patient reports wanting to leave today and will discuss it with provider shortly.  Will monitor.

## 2023-07-13 DIAGNOSIS — F411 Generalized anxiety disorder: Secondary | ICD-10-CM | POA: Diagnosis not present

## 2023-07-13 DIAGNOSIS — F4310 Post-traumatic stress disorder, unspecified: Secondary | ICD-10-CM | POA: Diagnosis not present

## 2023-07-13 DIAGNOSIS — F339 Major depressive disorder, recurrent, unspecified: Secondary | ICD-10-CM | POA: Diagnosis not present

## 2023-07-20 DIAGNOSIS — F4310 Post-traumatic stress disorder, unspecified: Secondary | ICD-10-CM | POA: Diagnosis not present

## 2023-07-20 DIAGNOSIS — F411 Generalized anxiety disorder: Secondary | ICD-10-CM | POA: Diagnosis not present

## 2023-07-20 DIAGNOSIS — F339 Major depressive disorder, recurrent, unspecified: Secondary | ICD-10-CM | POA: Diagnosis not present

## 2023-07-27 DIAGNOSIS — F411 Generalized anxiety disorder: Secondary | ICD-10-CM | POA: Diagnosis not present

## 2023-07-27 DIAGNOSIS — F339 Major depressive disorder, recurrent, unspecified: Secondary | ICD-10-CM | POA: Diagnosis not present

## 2023-07-27 DIAGNOSIS — F4310 Post-traumatic stress disorder, unspecified: Secondary | ICD-10-CM | POA: Diagnosis not present

## 2023-08-10 DIAGNOSIS — F4310 Post-traumatic stress disorder, unspecified: Secondary | ICD-10-CM | POA: Diagnosis not present

## 2023-08-10 DIAGNOSIS — F339 Major depressive disorder, recurrent, unspecified: Secondary | ICD-10-CM | POA: Diagnosis not present

## 2023-08-10 DIAGNOSIS — F411 Generalized anxiety disorder: Secondary | ICD-10-CM | POA: Diagnosis not present

## 2023-08-12 DIAGNOSIS — F331 Major depressive disorder, recurrent, moderate: Secondary | ICD-10-CM | POA: Diagnosis not present

## 2023-08-17 DIAGNOSIS — F411 Generalized anxiety disorder: Secondary | ICD-10-CM | POA: Diagnosis not present

## 2023-08-17 DIAGNOSIS — F339 Major depressive disorder, recurrent, unspecified: Secondary | ICD-10-CM | POA: Diagnosis not present

## 2023-08-17 DIAGNOSIS — F4310 Post-traumatic stress disorder, unspecified: Secondary | ICD-10-CM | POA: Diagnosis not present

## 2023-08-24 DIAGNOSIS — F411 Generalized anxiety disorder: Secondary | ICD-10-CM | POA: Diagnosis not present

## 2023-08-24 DIAGNOSIS — F339 Major depressive disorder, recurrent, unspecified: Secondary | ICD-10-CM | POA: Diagnosis not present

## 2023-08-24 DIAGNOSIS — F4310 Post-traumatic stress disorder, unspecified: Secondary | ICD-10-CM | POA: Diagnosis not present

## 2023-08-31 DIAGNOSIS — F4312 Post-traumatic stress disorder, chronic: Secondary | ICD-10-CM | POA: Diagnosis not present

## 2024-02-05 NOTE — ED Provider Notes (Signed)
 EMERGENCY MEDICINE ATTENDING PHYSICIAN NOTE  Patient Identification   Patient Name: Ivan Kramer MRN: [id number] DOB: 1996/01/15 Date of ED Visit: 02/05/2024    Chief Complaint  Abdominal Pain  *Chief complaint taken by nurses and may be different than information provided to myself. Triage note/Chief complaint investigated with patient and note reflects concerns noted to me by the patient.    *Chart was created with the use of the [employer] artificial intelligence medical transcription service and may contain some errors in reporting until finalized note signed. Please do not use this for clinical documentation and confirm history with patient while note in shared state.*  HPI:  Independent Historian:  patient  History Limitation none  History of Present Illness Ivan Kramer is a 29 year old male who presents with a recurrent lump and pain in the groin area. He was referred by his general practitioner to a general surgeon for further evaluation. Per review of outside records pt was to get a groin US   He has experienced a lump or cyst in the groin area for several years, with pain radiating to the right testicular area. The pain varies in intensity, sometimes sharp and other times dull, but never severe. It can be absent for weeks before returning.  He previously consulted his general practitioner, who was unable to determine the cause and referred him to a general surgeon. However, he has not been contacted by the surgeon for further evaluation. He is seeking further evaluation today as the pain has been bothering him, although it is currently minimal. Pt actually here because he didn't have a ride back home and figured he didn't know where else to go to wait until this am when his fiance could help him get a ride and figured he'd get it checked out since he had pain earlier today that had since resolved. Pt otherwise stated he wouldn't have come in here today.  No associated symptoms such as  changes in bowel habits, burning or pain with urination, or chest pain. He reports frequent urination, which he states is a general occurrence and not specifically related to the episodes of pain.       Past Medical / Surgical History  Medical History[1] Surgical History[2]   Medications / Allergies  No current outpatient medications Allergies[3]   Family / Social History  Family History[4] Social History[5]   Physical Exam:  Physical Exam Vitals:   02/05/24 0438  BP: 130/82  Pulse: 82  Resp: 16  Temp: 36.8 C (98.2 F)  SpO2: 100%  Weight: 86.2 kg (190 lb)  Height: 1.727 m (5' 8)     Physical Exam Vitals and nursing note reviewed.  Constitutional:      General: He is not in acute distress.    Appearance: Normal appearance. He is well-developed. He is not ill-appearing.  HENT:     Head: Normocephalic and atraumatic.     Mouth/Throat:     Mouth: Mucous membranes are moist.  Eyes:     General: No scleral icterus.    Extraocular Movements: Extraocular movements intact.     Conjunctiva/sclera: Conjunctivae normal.     Pupils: Pupils are equal, round, and reactive to light.  Cardiovascular:     Rate and Rhythm: Normal rate and regular rhythm.     Pulses: Normal pulses.     Heart sounds: Normal heart sounds. No murmur heard.    No friction rub. No gallop.  Pulmonary:     Effort: Pulmonary effort is normal. No respiratory  distress.     Breath sounds: Normal breath sounds.  Abdominal:     Palpations: Abdomen is soft.     Tenderness: There is no abdominal tenderness. There is no guarding or rebound.     Hernia: No hernia is present.  Genitourinary:    Penis: Normal.      Testes: Normal. Cremasteric reflex is present.     Comments: No abnormal lie Musculoskeletal:        General: No swelling. Normal range of motion.     Cervical back: Neck supple.     Right lower leg: No edema.     Left lower leg: No edema.  Skin:    General: Skin is warm and dry.      Capillary Refill: Capillary refill takes less than 2 seconds.  Neurological:     General: No focal deficit present.     Mental Status: He is alert and oriented to person, place, and time.     Cranial Nerves: No cranial nerve deficit.     Sensory: No sensory deficit.     Motor: No weakness.     Coordination: Coordination normal.     Gait: Gait normal.  Psychiatric:        Mood and Affect: Mood normal.                              Laboratory Data  Labs Reviewed - No data to display I personally reviewed and interpreted the ED lab data.   Imaging  ED Imaging: No orders to display   Radiology results were reviewed and interpreted independently in ED course/MDM    EKG  EKG Report: EKG has been reviewed and interpreted by ED physician.   EKG not ordered   Clinical Course and Thought Process: Diagnoses as of 02/05/24 0538  Right lower quadrant abdominal pain    Consult(s):     External Data Reviewed:   [facility] records for hx. Pt actually seen at [facility] w/ pcp for this   Internal and external medical records as available were reviewed to gather the pertinent past medical history and historical context germane to this case today as noted in ED Course, HPI, MDM  Medical Decision Making:   Initial MDM:   Medical Decision Making A 29 year old male presented with a history of chronic intermittent right lower abdominal pain associated with a palpable lump, radiating to the groin , ongoing for over two years with episodic exacerbations. He denied gastrointestinal or urinary symptoms such as diarrhea, constipation, dysuria, or hematuria, and reported no systemic symptoms. Examination during the visit revealed no concerning findings and the pain had resolved to near zero at the time of evaluation. There was no evidence of acute distress or emergent pathology on exam. Pt stated he wouldn't have come in here if he was able to get his ride today as well and has nl  vital signs and exam  Chronic intermittent right lower abdominal pain with palpable lump - Advised follow-up with primary care provider for further evaluation. - Recommended primary care provider to order a CT scan or refer to a gastroenterologist for further assessment. -lower index of suspicion of torsion given no pain, comes every few weeks, pain located in RLQ w/ tender area and nl cremasteric reflex, no abnormal lie, no masses or tender areas     Procedures  Procedures      Escalation of care considered including the need  for admission, discharge, social resources available to the patient with respect to level of care needed, intensive care v floor v observation.   Labs/Imaging/ECG: Workup as noted above, ordered and interpreted independently by me in the ED course below.     Tests or Procedures Considered but not Perfomed: labwork, us  or ct imaging based on time of onset, no sx at this time   Patient is homeless which impacted treatment     Final Diagnosis(es)  1. 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)   Received from [facility]   AUDIT-C    Q1: How often do you have a drink containing alcohol?: 2-3 times a week    Q2: How many drinks containing alcohol do you have on a typical day when you are drinking?: 1 or 2    Q3: How often do you have six or more drinks on one occasion?: Less than monthly  Financial Resource Strain: High Risk (03/03/2023)   Received from [facility]   Overall Financial Resource Strain (CARDIA)    Difficulty of Paying Living Expenses: Hard  Food Insecurity:  Food Insecurity Present (07/06/2023)   Received from [facility]   Hunger Vital Sign    Within the past 12 months, you worried that your food would run out before you got the money to buy more.: Often true    Within the past 12 months, the food you bought just didn't last and you didn't have money to get more.: Often true  Transportation Needs: Unmet Transportation Needs (07/06/2023)   Received from [facility] - Transportation    In the past 12 months, has lack of transportation kept you from medical appointments or from getting medications?: Yes    In the past 12 months, has lack of transportation kept you from meetings, work, or from getting things needed for daily living?: Yes  Physical Activity: Insufficiently Active (03/03/2023)   Received from [facility]   Exercise Vital Sign    On average, how many days per week do you engage in moderate to strenuous exercise (like a brisk walk)?: 2 days    On average, how many minutes do you engage in exercise at this level?: 20 min  Stress: Stress Concern Present (03/03/2023)   Received from [facility] - Occupational Stress Questionnaire    Feeling of Stress : To some extent  Housing Stability: High Risk (03/03/2023)   Received from [facility] Stability Vital Sign    In the last 12 months, was there a time when you were not able to pay the mortgage or rent on time?: Yes    In the past 12 months, how many times have you moved where you were living?: 0    At any time in the past 12 months, were you homeless or living in a shelter (including now)?: No  Utilities: At Risk (07/06/2023)   Received from [facility] Utilities    In the past 12 months has the electric, gas, oil, or water company  threatened to shut off services in your home?: Yes    -Medications: Prescription level medications considered or given, as noted in MDM, EMAR, ED Course -Prescriptions: Patient  prescriptions, if applicable, noted in MDM, EMAR, ED Course, EPIC After Visit Summary Prescription Drugs Considered : Pain Medications no sx now   Treatment Space and Documentation  In order to expedite care, in the spirit of patient safety, and due to the absence of available treatment space, the patient may have been initially evaluated in our rapid triage area where they received a history and physical. An explanation was provided describing our limited bed capacity today and the need for a conversation in this less private area in order to expedite care, identify illness quickly, and decrease time to meaningful intervention. The patient was offered a more private space in which to conduct a history and physical but also provided an explanation about unclear time this space could be offered. The patient verbalized understanding and elected to continue the history and physical in this space and  expressed appreciation for the earlier evaluation and workup.  There was no perception that the patient was uncomfortable in continuing evaluation at any point during the conversation. Attempts to minimize the volume of the conversation were made and any sensitive historical or physical exam components were deferred to being placed in a private room.  Regarding flow and next steps, I explained that their work-up would be conducted while waiting for a treatment space, that labs will drawn in our lab draw area, and any imaging or consultations that will be ordered might be conducted during their waiting room stay.  I explained that it will take approximately 2 hours for lab results from the time of sample collection and approximately 2 to 4 hours for diagnostic imaging completion. I explained that if applicable, treatment may also be provided out of our rapid care area prior to them receiving a room. I explained that we would make the best use of the patient's waiting time today and that I would happily answer any  additional questions or concerns they had at any point in time.        [1] No past medical history on file. [2] No past surgical history on file. [3] Allergies Allergen Reactions   Lamictal [Lamotrigine]   [4] No family history on file. [5] Social History Socioeconomic History   Marital status: Significant Other   Social Drivers of Corporate Investment Banker Strain: High Risk (03/03/2023)   Received from [facility]   Overall Financial Resource Strain (CARDIA)    Difficulty of Paying Living Expenses: Hard  Food Insecurity: Food Insecurity Present (07/06/2023)   Received from [facility]   Hunger Vital Sign    Within the past 12 months, you worried that your food would run out before you got the money to buy more.: Often true    Within the past 12 months, the food you bought just didn't last and you didn't have money to get more.: Often true  Transportation Needs: Unmet Transportation Needs (07/06/2023)   Received from [facility] - Transportation    In the past 12 months, has lack of transportation kept you from medical appointments or from getting medications?: Yes    In the past 12 months, has lack of transportation kept you from meetings, work, or from getting things needed for daily living?: Yes  Physical Activity: Insufficiently Active (03/03/2023)   Received from [facility]   Exercise Vital Sign  On average, how many days per week do you engage in moderate to strenuous exercise (like a brisk walk)?: 2 days    On average, how many minutes do you engage in exercise at this level?: 20 min  Stress: Stress Concern Present (03/03/2023)   Received from [facility] - Occupational Stress Questionnaire    Feeling of Stress : To some extent  Social Connections: Socially Integrated (03/03/2023)   Received from [facility]   Social Network    How would you rate your social network (family, work, friends)?:  Good participation with social networks  Intimate Partner Violence: Not At Risk (07/06/2023)   Received from [facility]   Humiliation, Afraid, Rape, and Kick questionnaire    Within the last year, have you been afraid of your partner or ex-partner?: No    Within the last year, have you been humiliated or emotionally abused in other ways by your partner or ex-partner?: No    Within the last year, have you been kicked, hit, slapped, or otherwise physically hurt by your partner or ex-partner?: No    Within the last year, have you been raped or forced to have any kind of sexual activity by your partner or ex-partner?: No  Housing Stability: High Risk (03/03/2023)   Received from [facility] Stability Vital Sign    In the last 12 months, was there a time when you were not able to pay the mortgage or rent on time?: Yes    In the past 12 months, how many times have you moved where you were living?: 0    At any time in the past 12 months, were you homeless or living in a shelter (including now)?: No   Hale Netter, MD 02/05/24 [phone]

## 2024-02-20 ENCOUNTER — Ambulatory Visit (HOSPITAL_COMMUNITY)
Admission: EM | Admit: 2024-02-20 | Discharge: 2024-02-20 | Disposition: A | Discharge status: Home / Self Care | Payer: MEDICAID | Attending: Psychiatry | Admitting: Psychiatry

## 2024-02-20 DIAGNOSIS — Z79899 Other long term (current) drug therapy: Secondary | ICD-10-CM | POA: Insufficient documentation

## 2024-02-20 DIAGNOSIS — F411 Generalized anxiety disorder: Secondary | ICD-10-CM | POA: Insufficient documentation

## 2024-02-20 DIAGNOSIS — F329 Major depressive disorder, single episode, unspecified: Secondary | ICD-10-CM | POA: Insufficient documentation

## 2024-02-20 DIAGNOSIS — Z760 Encounter for issue of repeat prescription: Secondary | ICD-10-CM | POA: Insufficient documentation

## 2024-02-20 MED ORDER — MIRTAZAPINE 15 MG PO TABS: 15.0000 mg | ORAL_TABLET | Freq: Every day | ORAL | 0 refills | Status: AC

## 2024-02-20 NOTE — Progress Notes (Signed)
" °   02/20/24 1645  [facility] Triage Screening (Walk-ins at [facility] only)  How Did You Hear About Us ? Self  What Is the Reason for Your Visit/Call Today? Haack is a 29 year old male presenting to [facility] unaccomapnied. Pt states he has PTSD, Panic Disorder and MDD and is looking for a refill for his Mirtazapine . Pt states he has been out of this medication for 5 days and is looking for a refill to last him a few days. He states that he has taken Hydroxyzine , but reports this is not working. Pt states he is not wanting to be admited at this time. Pt states he is is looking for any PRN medication. Pt denies substance use in the past 24 hours, Si, Hi, NSSIB and AVH.  How Long Has This Been Causing You Problems? <Week  Have You Recently Had Any Thoughts About Hurting Yourself? No  Are You Planning to Commit Suicide/Harm Yourself At This time? No  Have you Recently Had Thoughts About Hurting Someone Sherral? No  Are You Planning To Harm Someone At This Time? No  Exploitation of patient/patient's resources Denies  Self-Neglect Denies  Possible abuse reported to: Other (Comment)  Are you currently experiencing any auditory, visual or other hallucinations? No  Have You Used Any Alcohol or Drugs in the Past 24 Hours? No  Do you have any current medical co-morbidities that require immediate attention? No  What Do You Feel Would Help You the Most Today? Medication(s)  If access to [facility] was not available, would you have sought care in the Emergency Department? No  Determination of Need Routine (7 days)  Options For Referral Medication Management    "

## 2024-02-20 NOTE — Discharge Summary (Signed)
 Ivan Kramer to be discharged Home per NP order. An After Visit Summary was printed and given to the patient by provider.Patient escorted out, and discharged home via private auto.  [name]  02/20/2024 5:53 PM

## 2024-02-20 NOTE — Discharge Instructions (Signed)
 Discharge recommendations:   Medications: Patient is to take medications as prescribed. The patient or patient's guardian is to contact a medical professional and/or outpatient provider to address any new side effects that develop. The patient or the patient's guardian should update outpatient providers of any new medications and/or medication changes.   Outpatient Follow up: Please review list of outpatient resources for psychiatry and counseling. Please follow up with your primary care provider for all medical related needs.   Therapy: We recommend that patient participate in individual therapy to address mental health concerns.  Safety:   The following safety precautions should be taken:   No sharp objects. This includes scissors, razors, scrapers, and putty knives.   Chemicals should be removed and locked up.   Medications should be removed and locked up.   Weapons should be removed and locked up. This includes firearms, knives and instruments that can be used to cause injury.   The patient should abstain from use of illicit substances/drugs and abuse of any medications.  If symptoms worsen or do not continue to improve or if the patient becomes actively suicidal or homicidal then it is recommended that the patient return to the closest hospital emergency department, the [facility], or call 911 for further evaluation and treatment. National Suicide Prevention Lifeline 1-800-SUICIDE or 331-571-9803.  About 988 988 offers 24/7 access to trained crisis counselors who can help people experiencing mental health-related distress. People can call or text 988 or chat 988lifeline.org for themselves or if they are worried about a loved one who may need crisis support.    You are encouraged to follow up with [facility] for outpatient treatment.  Walk in/ Open Access Hours: Monday - Friday 8AM - 10AM (To see provider and therapist)   Please arrive to the second floor around 7:00AM.   [facility] [address] [place], [place] [phone]
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
## 2024-02-23 ENCOUNTER — Encounter (HOSPITAL_COMMUNITY): Payer: Self-pay

## 2024-02-23 ENCOUNTER — Ambulatory Visit (HOSPITAL_COMMUNITY)
Admission: EM | Admit: 2024-02-23 | Discharge: 2024-02-23 | Disposition: A | Discharge status: Home / Self Care | Payer: MEDICAID | Source: Home / Self Care

## 2024-02-23 ENCOUNTER — Other Ambulatory Visit (HOSPITAL_COMMUNITY): Payer: Self-pay

## 2024-02-23 DIAGNOSIS — J029 Acute pharyngitis, unspecified: Secondary | ICD-10-CM | POA: Diagnosis not present

## 2024-02-23 DIAGNOSIS — R051 Acute cough: Secondary | ICD-10-CM | POA: Diagnosis not present

## 2024-02-23 LAB — POCT RAPID STREP A (OFFICE): Rapid Strep A Screen: NEGATIVE

## 2024-02-23 LAB — POCT INFLUENZA A/B
Influenza A, POC: NEGATIVE
Influenza B, POC: NEGATIVE

## 2024-02-23 LAB — POC SOFIA SARS ANTIGEN FIA: SARS Coronavirus 2 Ag: NEGATIVE

## 2024-02-23 MED ORDER — LIDOCAINE VISCOUS HCL 2 % MT SOLN
15.0000 mL | OROMUCOSAL | 0 refills | Status: AC | PRN
  Filled 2024-02-23: qty 100, 1d supply, fill #0

## 2024-02-23 NOTE — ED Triage Notes (Signed)
 Patient reports that he began having a scratchy throat 2 days ago.  Today patient c/o slight SOB, body aches, and in intermittent NP cough. Patient states he vomited x 1, but did so because his mom gave him too much zinc.  Patient has also taken Ibuprofen  and Tylenol  for his symptoms.

## 2024-02-23 NOTE — Discharge Instructions (Signed)
 You have a viral upper respiratory infection.  COVID-19, influenza, strep throat tests are negative today.  Strep throat culture is pending.  Symptoms should improve over the next week to 10 days.  If you develop chest pain or shortness of breath, go to the emergency room.  Some things that can make you feel better are: - Increased rest - Increasing fluid with water/sugar free electrolytes - Acetaminophen  and ibuprofen  as needed for fever/pain - Salt water gargling, chloraseptic spray and throat lozenges - OTC guaifenesin (Mucinex) 600 mg twice daily for congestion - Saline sinus flushes or a neti pot - Humidifying the air - Lidocaine  rinses as needed for sore throat

## 2024-02-26 LAB — CULTURE, GROUP A STREP (THRC)
